# Patient Record
Sex: Female | Born: 1950
Health system: Southern US, Community
[De-identification: ages and names within clinical notes are randomized; demographics above are authoritative.]

## PROBLEM LIST (undated history)

## (undated) DIAGNOSIS — E559 Vitamin D deficiency, unspecified: Secondary | ICD-10-CM

## (undated) DIAGNOSIS — E785 Hyperlipidemia, unspecified: Secondary | ICD-10-CM

## (undated) DIAGNOSIS — R6882 Decreased libido: Secondary | ICD-10-CM

## (undated) DIAGNOSIS — E039 Hypothyroidism, unspecified: Secondary | ICD-10-CM

## (undated) HISTORY — DX: Vitamin D deficiency, unspecified: E55.9

## (undated) HISTORY — PX: APPENDECTOMY: SHX54

## (undated) HISTORY — PX: BREAST SURGERY: SHX581

## (undated) HISTORY — PX: AUGMENTATION MAMMAPLASTY: SUR837

## (undated) HISTORY — DX: Decreased libido: R68.82

## (undated) HISTORY — DX: Hypothyroidism, unspecified: E03.9

## (undated) HISTORY — DX: Hyperlipidemia, unspecified: E78.5

---

## 2017-07-17 ENCOUNTER — Ambulatory Visit (INDEPENDENT_AMBULATORY_CARE_PROVIDER_SITE_OTHER): Payer: Medicare Other | Admitting: Family Medicine

## 2017-07-17 ENCOUNTER — Other Ambulatory Visit: Payer: Self-pay

## 2017-07-17 ENCOUNTER — Encounter: Payer: Self-pay | Admitting: Family Medicine

## 2017-07-17 VITALS — BP 120/80 | HR 65 | Temp 97.5°F | Resp 16 | Ht 66.5 in | Wt 172.2 lb

## 2017-07-17 DIAGNOSIS — Z79899 Other long term (current) drug therapy: Secondary | ICD-10-CM | POA: Diagnosis not present

## 2017-07-17 DIAGNOSIS — Z79890 Hormone replacement therapy (postmenopausal): Secondary | ICD-10-CM | POA: Diagnosis not present

## 2017-07-17 DIAGNOSIS — R002 Palpitations: Secondary | ICD-10-CM | POA: Diagnosis not present

## 2017-07-17 DIAGNOSIS — Z9229 Personal history of other drug therapy: Secondary | ICD-10-CM | POA: Diagnosis not present

## 2017-07-17 DIAGNOSIS — E039 Hypothyroidism, unspecified: Secondary | ICD-10-CM | POA: Diagnosis not present

## 2017-07-17 LAB — HEPATIC FUNCTION PANEL
AG Ratio: 1.6 (calc) (ref 1.0–2.5)
ALBUMIN MSPROF: 4.1 g/dL (ref 3.6–5.1)
ALT: 8 U/L (ref 6–29)
AST: 12 U/L (ref 10–35)
Alkaline phosphatase (APISO): 43 U/L (ref 33–130)
BILIRUBIN DIRECT: 0.1 mg/dL (ref 0.0–0.2)
GLOBULIN: 2.5 g/dL (ref 1.9–3.7)
Indirect Bilirubin: 0.4 mg/dL (calc) (ref 0.2–1.2)
TOTAL PROTEIN: 6.6 g/dL (ref 6.1–8.1)
Total Bilirubin: 0.5 mg/dL (ref 0.2–1.2)

## 2017-07-17 LAB — CBC WITH DIFFERENTIAL/PLATELET
BASOS PCT: 1 %
Basophils Absolute: 69 cells/uL (ref 0–200)
EOS PCT: 1.3 %
Eosinophils Absolute: 90 cells/uL (ref 15–500)
HCT: 42 % (ref 35.0–45.0)
Hemoglobin: 13.7 g/dL (ref 11.7–15.5)
Lymphs Abs: 1718 cells/uL (ref 850–3900)
MCH: 28.9 pg (ref 27.0–33.0)
MCHC: 32.6 g/dL (ref 32.0–36.0)
MCV: 88.6 fL (ref 80.0–100.0)
MONOS PCT: 7.6 %
MPV: 11.6 fL (ref 7.5–12.5)
NEUTROS PCT: 65.2 %
Neutro Abs: 4499 cells/uL (ref 1500–7800)
Platelets: 265 10*3/uL (ref 140–400)
RBC: 4.74 10*6/uL (ref 3.80–5.10)
RDW: 12.1 % (ref 11.0–15.0)
TOTAL LYMPHOCYTE: 24.9 %
WBC: 6.9 10*3/uL (ref 3.8–10.8)
WBCMIX: 524 {cells}/uL (ref 200–950)

## 2017-07-17 LAB — LIPID PANEL
CHOL/HDL RATIO: 4.5 (calc) (ref <5.0)
CHOLESTEROL: 288 mg/dL — AB (ref <200)
HDL: 64 mg/dL (ref >50)
LDL Cholesterol (Calc): 201 mg/dL (calc) — ABNORMAL HIGH
Non-HDL Cholesterol (Calc): 224 mg/dL (calc) — ABNORMAL HIGH (ref <130)
TRIGLYCERIDES: 97 mg/dL (ref <150)

## 2017-07-17 LAB — BASIC METABOLIC PANEL
BUN: 12 mg/dL (ref 7–25)
CALCIUM: 9.3 mg/dL (ref 8.6–10.4)
CHLORIDE: 102 mmol/L (ref 98–110)
CO2: 27 mmol/L (ref 20–32)
CREATININE: 0.92 mg/dL (ref 0.50–0.99)
Glucose, Bld: 82 mg/dL (ref 65–139)
POTASSIUM: 4.5 mmol/L (ref 3.5–5.3)
Sodium: 137 mmol/L (ref 135–146)

## 2017-07-17 LAB — TSH: TSH: 1.35 m[IU]/L (ref 0.40–4.50)

## 2017-07-17 MED ORDER — ESTRADIOL 0.05 MG/24HR TD PTTW
1.0000 | MEDICATED_PATCH | TRANSDERMAL | 2 refills | Status: DC
Start: 2017-07-17 — End: 2017-08-19

## 2017-07-17 MED ORDER — PROGESTERONE MICRONIZED 100 MG PO CAPS: 100.0000 mg | ORAL_CAPSULE | Freq: Every day | ORAL | 2 refills | Status: DC

## 2017-07-17 NOTE — Progress Notes (Signed)
Patient ID: Rachael Padilla, female    DOB: 07-19-1951, 66 y.o.   MRN: [id number]  Chief Complaint  Patient presents with  . Other    hormone therapy- has been on for 8-10 years    Allergies Patient has no known allergies.  Subjective:   Rachael Padilla is a 66 y.o. female who presents to [facility] today.  HPI Here to establish care. Patient reports that she has lived in [place] for the past 14 years. Reports that her husband and her decided to move to [place], [place]. Reports that he chose to live here because they wanted to be near hills and mountains. 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 She tries to exercise and she swims each day.  She reports that most her family lives in [place] and also has a twin sister in [place].  Has several children but they do not live in this area.   Reports that she cannot make it without hormone replacement therapy. Reports that she has been on it for 8-10 years and it makes her life bearable. Reports that if she is not on any hormone replacement therapy that she has a lot of pain. She reports that she has seen lots of doctors for this feeling and pain and she believes it has been determined that her body pain is due to estrogen loss. She reports that she has seen doctors throughout [place], in [place], [place], [place]. She reports that she has an estrogen deficiency and her symptoms are much improved on hormone replacement therapy. She brings in her hormone replacement therapy that she was taking which is from [place]. She was on progesterone 200 mg a day and estrogen patch 50. She denies any higher gynecologic problems. She reports that she has had no vaginal bleeding. She went through menopause sometime in her 48s and started hormone replacement shortly after that. Reports that if she does not take hormone replacement therapy that her body hurts, vaginal dryness it is unbearable, and she  suffers in her mood. She reports that she does not get hot flashes. Reports that she is also been told that she is hypothyroid. Seen by Dr. Augusto Gamble in [place], [place]. Put on actual thyroid medication. However she has not been on any thyroid medication for months. Would like to get her levels checked. Believe that the combo that she was on in [place] for hormones helped her symptoms and pain. Believes that the loss of hormones was the problems.  Was on thyroid 81.25 mg SR capsules, [facility].  After talking with patient for extended period of time I listened to her heart which sounded to have some extra beats. Then upon questioning she reports that she has had a long history of palpitations on and off but they don't really bother her. She reports that every now and then her heart will race and skipped beats. She denies any chest pain shortness of breath. She denies any shortness of breath or symptoms with the palpitations. She reports that they can come on with exercise, rest, or for no reason at all. Nothing seems to make them better and nothing seems to make them worse. She reports that she was told in the past that she had a heart murmur but has never had any evaluation of this other than she thinks she had an ultrasound in the past but does not know the results.      Past Medical History:  Diagnosis Date  . Hypothyroid     Past Surgical History:  Procedure Laterality Date  . APPENDECTOMY     early 42s  . BREAST SURGERY     implants- early 2000    Family History  Problem Relation Age of Onset  . Mental illness Mother   . Dementia Mother   . Early death Father   . Emphysema Father   . Arthritis Brother   . Early death Brother   . Cirrhosis Brother   . Alcohol abuse Brother      Social History   Social History  . Marital status: Married    Spouse name: N/A  . Number of children: N/A  . Years of education: N/A   Occupational History  . retired      Social History Main Topics  . Smoking status: Former Smoker    Quit date: 1979  . Smokeless tobacco: Never Used  . Alcohol use Yes     Comment: rarely  . Drug use: No  . Sexual activity: Yes    Partners: Male    Birth control/ protection: Post-menopausal   Other Topics Concern  . None   Social History Narrative   Lives in [place]. Moved from [place]. 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     Review of Systems   Objective:   BP 120/80 (BP Location: Left Arm, Patient Position: Sitting, Cuff Size: Normal)   Pulse 65   Temp (!) 97.5 F (36.4 C) (Other (Comment))   Resp 16   Ht 5' 6.5" (1.689 m)   Wt 172 lb 4 oz (78.1 kg)   SpO2 99%   BMI 27.39 kg/m   Physical Exam  Constitutional: She is oriented to person, place, and time. She appears well-developed and well-nourished. No distress.  HENT:  Head: Normocephalic and atraumatic.  Eyes: Pupils are equal, round, and reactive to light. Conjunctivae and EOM are normal.  Neck: Normal range of motion. Neck supple. No thyromegaly present.  Cardiovascular: Normal rate and normal heart sounds.  An irregular rhythm present.  Pulmonary/Chest: Effort normal and breath sounds normal. No respiratory distress.  Neurological: She is alert and oriented to person, place, and time. No cranial nerve deficit.  Skin: Skin is warm and dry.  Nursing note and vitals reviewed. Psychiatric: Mood slightly elevated, affect consistent with mood. Pleasant female. Behavior and dress appropriate. Denies suicidal ideations. EKG performed which revealed PVCs.  Assessment and Plan   1. Hypothyroidism, unspecified type Not been on medication for months. Check levels at this time and start therapy as needed or indicated. - TSH  2. Hormone replacement therapy (HRT) Counseled in detail regarding risks of hormone replacement therapy including blood clots, heart attack, stroke, neurologic complications, and possible risk of death and disability. Patient  reports that she is unable to tolerate not having hormone replacement therapy. He requested higher dose of estradiol. However after discussion with her I told her I'm not comfortable giving her higher dose medication at this time we will try this brand and this formulation at this time and follow-up in a month. - estradiol (VIVELLE-DOT) 0.05 MG/24HR patch; Place 1 patch (0.05 mg total) onto the skin 2 (two) times a week.  Dispense: 8 patch; Refill: 2 - progesterone (PROMETRIUM) 100 MG capsule; Take 1 capsule (100 mg total) by mouth daily.  Dispense: 30 capsule; Refill: 2  3. High risk medication use Discussed risks of medications and laboratory monitoring needed. Voiced understanding. - Basic metabolic panel - CBC with Differential/Platelet - Hepatic  function panel - Lipid panel  4. Heart palpitations Reportedly a chronic issue with possibly some mitral valve prolapse. Patient needs evaluation at this time and monitor placed. Check labs. EKG performed. Patient was counseled to decrease her caffeine intake. She was told that if she developed any worrisome signs or symptoms to go to the emergency department for evaluation. - Ambulatory referral to Cardiology  Patient to follow-up for complete physical examination. Reports she has had a mammogram, bone density, Pap smear, and complete physical exam in [place] this calendar year, but will follow-up and bring records of testing. Immunization records requested. Diet and exercise modifications discussed. Office visit was greater than 45 minutes. Return in about 4 weeks (around 08/14/2017) for Physical/HRT. Caren Macadam, MD 07/17/2017

## 2017-07-18 ENCOUNTER — Encounter: Payer: Self-pay | Admitting: Family Medicine

## 2017-07-22 ENCOUNTER — Telehealth: Payer: Self-pay | Admitting: *Deleted

## 2017-07-22 DIAGNOSIS — Z79890 Hormone replacement therapy (postmenopausal): Secondary | ICD-10-CM

## 2017-07-22 MED ORDER — PROGESTERONE MICRONIZED 200 MG PO CAPS: 200.0000 mg | ORAL_CAPSULE | Freq: Every day | ORAL | 1 refills | Status: DC

## 2017-07-22 NOTE — Telephone Encounter (Signed)
Patient called left message stating she would like for her progesterone 100mg  to be changed to 200mg . Patient stated she 100mg  makes her wake up every hour at night. Please advise patient if this can be changed to 200mg . [phone]

## 2017-07-22 NOTE — Telephone Encounter (Signed)
Please advise that I called in the prometrium 200 mg a day. D/c the other dose.  Keep follow up.  Gwen Her. Mannie Stabile, MD

## 2017-07-23 NOTE — Telephone Encounter (Signed)
Patient informed of message below, verbalized understanding.  

## 2017-08-04 ENCOUNTER — Telehealth: Payer: Self-pay | Admitting: *Deleted

## 2017-08-04 DIAGNOSIS — R002 Palpitations: Secondary | ICD-10-CM

## 2017-08-04 NOTE — Telephone Encounter (Signed)
Patient called left message stating hear care told her they will need a new referral, patient is requesting a referral to be resent so she can be seen. Please advise

## 2017-08-05 NOTE — Telephone Encounter (Signed)
Please check into this, I did a referral. See if done. Gwen Her. Mannie Stabile, MD

## 2017-08-05 NOTE — Telephone Encounter (Signed)
A referral had been placed, but when they called patient to schedule she told them she was not aware of the referral and did not want to schedule until she had talked to [place]. She called and I explained why she was being referred so she called back to schedule but by then they had already 'canceled' the referral and they were unable to authorize and schedule from that referral .

## 2017-08-05 NOTE — Telephone Encounter (Signed)
Done

## 2017-08-05 NOTE — Telephone Encounter (Signed)
Place again under the same reason. Rachael Padilla. Rachael Stabile, MD

## 2017-08-07 ENCOUNTER — Telehealth: Payer: Self-pay | Admitting: *Deleted

## 2017-08-07 NOTE — Telephone Encounter (Signed)
Patient called with questions regarding labs

## 2017-08-11 NOTE — Telephone Encounter (Signed)
Called patient regarding message below. No answer, unable to leave message.  

## 2017-08-12 ENCOUNTER — Encounter: Payer: Self-pay | Admitting: Family Medicine

## 2017-08-19 ENCOUNTER — Other Ambulatory Visit: Payer: Self-pay

## 2017-08-19 ENCOUNTER — Encounter: Payer: Self-pay | Admitting: Family Medicine

## 2017-08-19 ENCOUNTER — Ambulatory Visit (INDEPENDENT_AMBULATORY_CARE_PROVIDER_SITE_OTHER): Payer: Medicare Other | Admitting: Family Medicine

## 2017-08-19 VITALS — BP 120/76 | HR 89 | Temp 98.1°F | Resp 16 | Ht 67.0 in | Wt 173.5 lb

## 2017-08-19 DIAGNOSIS — E7800 Pure hypercholesterolemia, unspecified: Secondary | ICD-10-CM | POA: Diagnosis not present

## 2017-08-19 DIAGNOSIS — E039 Hypothyroidism, unspecified: Secondary | ICD-10-CM

## 2017-08-19 DIAGNOSIS — Z79890 Hormone replacement therapy (postmenopausal): Secondary | ICD-10-CM | POA: Diagnosis not present

## 2017-08-19 MED ORDER — ESTRADIOL 0.075 MG/24HR TD PTTW: 1.0000 | MEDICATED_PATCH | TRANSDERMAL | 12 refills | Status: DC

## 2017-08-19 NOTE — Progress Notes (Signed)
Patient ID: Rachael Padilla, female    DOB: May 01, 1951, 66 y.o.   MRN: [id number]  Chief Complaint  Patient presents with  . Follow-up    Allergies Patient has no known allergies.  Subjective:   Rachael Padilla is a 66 y.o. female who presents to [facility] today.  HPI Here to discuss her cholesterol results.  Reports that she has been told she has high cholesterol in the past and she was able to get her cholesterol lowered with dietary modifications.  She reports that she was previously exercising but has not been doing her swimming since she moved to [place].  Reports that after getting the news of her high cholesterol that she has just started the diet.   Would also like to discuss her hormone replacement therapy.  Would like to increase the estradiol to 0.075 mg / 24 hours.  Reports that she felt better when she was on this dosing.  Reports she is been on hormone replacement therapy for about 14 years.  Reports she did not initially go on hormone replacement therapy right after menopause because she was not symptomatic.  Reports if she does not take hormone replacement therapy that her body aches terribly.  She has vaginal pain if she is not on hormone replacement therapy.  Reports that she has terrible vaginal dryness and pain with intercourse if she is not on hormone replacement therapy.  Reports her mood is horrible and she cannot deal with not being on hormone replacement therapy.  She reports that she has been seen by multiple physicians in the past for hormone replacement therapy and is well aware of the risks.  But she reports that it is also a very big risk for her to be in a bad mood and have all the other symptoms that affect her quality of life and her not being able to function.    Past Medical History:  Diagnosis Date  . Hypothyroid     Past Surgical History:  Procedure Laterality Date  . APPENDECTOMY     early 55s  . BREAST SURGERY     implants- early 2000    Family History  Problem Relation Age of Onset  . Mental illness Mother   . Dementia Mother   . Early death Father   . Emphysema Father   . Arthritis Brother   . Early death Brother   . Cirrhosis Brother   . Alcohol abuse Brother      Social History   Socioeconomic History  . Marital status: Married    Spouse name: None  . Number of children: None  . Years of education: None  . Highest education level: None  Social Needs  . Financial resource strain: None  . Food insecurity - worry: None  . Food insecurity - inability: None  . Transportation needs - medical: None  . Transportation needs - non-medical: None  Occupational History  . Occupation: retired  Tobacco Use  . Smoking status: Former Smoker    Last attempt to quit: 1979    Years since quitting: 39.9  . Smokeless tobacco: Never Used  Substance and Sexual Activity  . Alcohol use: Yes    Comment: rarely  . Drug use: No  . Sexual activity: Yes    Partners: Male    Birth control/protection: Post-menopausal  Other Topics Concern  . None  Social History Narrative   Lives in [place]. Moved from [place]. 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     Review  of Systems   Objective:   BP 120/76 (BP Location: Left Arm, Patient Position: Sitting, Cuff Size: Normal)   Pulse 89   Temp 98.1 F (36.7 C) (Other (Comment))   Resp 16   Ht 5\' 7"  (1.702 m)   Wt 173 lb 8 oz (78.7 kg)   SpO2 97%   BMI 27.17 kg/m   Physical Exam  Constitutional: She appears well-developed and well-nourished.  HENT:  Head: Normocephalic and atraumatic.  Eyes: EOM are normal. Pupils are equal, round, and reactive to light.  Neck: Normal range of motion. Neck supple.  Cardiovascular: Normal rate and regular rhythm.  Psychiatric: She has a normal mood and affect. Her behavior is normal. Judgment and thought content normal.  Vitals reviewed.    Assessment and Plan    1. Pure hypercholesterolemia Hyperlipidemia and  the associated risk of ASCVD were discussed today. Primary vs. Secondary prevention of ASCVD were discussed and how it relates to patient morbidity, mortality, and quality of life. Shared decision making with patient including the risks of statins vs.benefits of ASCVD risk reduction discussed.   We discussed heart healthy diet, lifestyle modifications, risk factor modifications, and adherence to the recommended treatment plan.  Patient defers any statin therapy at this time.  She reports that she believes she can get her cholesterol values down to the recommended range without taking a medication.  She is agreed to start making these lifestyle changes and to return to the clinic in 3 months for a fasting lipid profile.  We did discuss today that people get elevated cholesterol by either producing it or eating it.  I told her that I guess she had familial hyper cholesterolemia because her LDL levels were so elevated.  She is still insistent on making dietary changes prior to starting any medications.  - Lipid panel  2. Hypothyroidism, unspecified type Thyroid results were discussed with patient.  She has been off thyroid medication for many months and her TSH was within normal limits.  She would like this rechecked in 3 months. - TSH  3. Hormone replacement therapy (HRT) Very long discussion today with patient regarding the risks of hormone therapy.  Today we discussed the cardiovascular, malignancy, thromboembolic, and liver issues related to hormone replacement therapy.  We also discussed how hormone replacement therapy could further elevate her cholesterol.  Her calculated 10-year ASCVD risk is less than 10%.  She has no family history of GYN malignancy or breast cancer.  Her mammogram is up-to-date and all her testing has been negative.  She reports that she is unable to survive and function without her hormone therapy due to her symptoms.  She does request the 0.75 dosing of the hormone replacement  therapy.  In addition she is continuing the progesterone as previously prescribed.  We did discuss that if she needs further increase in her hormone replacement therapy that she will need to see a gynecologist.  We also discussed that it is not recommended for people to generally be on hormone replacement therapy for more than 10 years after menopause.  She does not agree with this literature and believes that it is very beneficial to be on the hormone replacement therapy but does understand the risks and wishes to continue at this time. - estradiol (VIVELLE-DOT) 0.075 MG/24HR; Place 1 patch onto the skin 2 (two) times a week.  Dispense: 8 patch; Refill: 12 She does have an upcoming appointment with cardiology due to PVCs and questionable history of mitral valve prolapse.  Feel at this time it would be beneficial to get cardiac evaluation due to her continuing hormone replacement therapy.  Office visit greater than 30 minutes and greater than 50% of time spent face-to-face counseling patient. Return in about 4 weeks (around 09/16/2017) for follow up . Caren Macadam, MD 08/20/2017

## 2017-08-20 ENCOUNTER — Ambulatory Visit (INDEPENDENT_AMBULATORY_CARE_PROVIDER_SITE_OTHER): Payer: Medicare Other | Admitting: Cardiovascular Disease

## 2017-08-20 ENCOUNTER — Ambulatory Visit (INDEPENDENT_AMBULATORY_CARE_PROVIDER_SITE_OTHER): Payer: Medicare Other

## 2017-08-20 ENCOUNTER — Encounter: Payer: Self-pay | Admitting: Cardiovascular Disease

## 2017-08-20 ENCOUNTER — Other Ambulatory Visit: Payer: Self-pay

## 2017-08-20 ENCOUNTER — Encounter: Payer: Self-pay | Admitting: Family Medicine

## 2017-08-20 VITALS — BP 98/64 | HR 74 | Ht 66.5 in | Wt 172.0 lb

## 2017-08-20 DIAGNOSIS — E7800 Pure hypercholesterolemia, unspecified: Secondary | ICD-10-CM | POA: Diagnosis not present

## 2017-08-20 DIAGNOSIS — I341 Nonrheumatic mitral (valve) prolapse: Secondary | ICD-10-CM

## 2017-08-20 DIAGNOSIS — I499 Cardiac arrhythmia, unspecified: Secondary | ICD-10-CM | POA: Diagnosis not present

## 2017-08-20 DIAGNOSIS — R002 Palpitations: Secondary | ICD-10-CM

## 2017-08-20 LAB — ECHOCARDIOGRAM COMPLETE
HEIGHTINCHES: 66.5 in
WEIGHTICAEL: 2752 [oz_av]

## 2017-08-20 NOTE — Patient Instructions (Signed)
Medication Instructions:  Your physician recommends that you continue on your current medications as directed. Please refer to the Current Medication list given to you today.  Labwork: NONE  Testing/Procedures: Your physician has requested that you have an echocardiogram. Echocardiography is a painless test that uses sound waves to create images of your heart. It provides your doctor with information about the size and shape of your heart and how well your heart's chambers and valves are working. This procedure takes approximately one hour. There are no restrictions for this procedure.   Follow-Up: Your physician wants you to follow-up in: 6 MONTHS WITH DR. KONESWARAN. You will receive a reminder letter in the mail two months in advance. If you don't receive a letter, please call our office to schedule the follow-up appointment.  Any Other Special Instructions Will Be Listed Below (If Applicable).  If you need a refill on your cardiac medications before your next appointment, please call your pharmacy. 

## 2017-08-20 NOTE — Progress Notes (Signed)
CARDIOLOGY CONSULT NOTE  Patient ID: Rachael Padilla MRN: [id number] DOB/AGE: [birth year] 66 y.o.  Admit date: (Not on file) Primary Physician: Caren Macadam, MD Referring Physician: Dr. Mannie Stabile  Reason for Consultation: Palpitations  HPI: Rachael Padilla is a 66 y.o. female who is being seen today for the evaluation of palpitations at the request of Caren Macadam, MD.   She has lived in [place] [facility]) for the past 14 years and recently moved to [place] 10.5 weeks ago.  She and her husband wanted to be near the hills and mountains.  Her husband retired from Environmental [employer].  She is a Theatre manager by profession.  She exercises and swims on a daily basis up to 90 minutes daily.  She has children and grandchildren who live across the country.  She has a long history of palpitations.  She denies accompanying chest pain and shortness of breath.  They can occur both with exercise and while at rest.  There are no exacerbating or alleviating factors.  She was recently started on hormone replacement therapy by her PCP.  Lipids 07/17/17: Total cholesterol 288, LDL 201, HDL 64, triglycerides 97.  ECG performed on 07/17/17 which I personally interpreted demonstrated normal sinus rhythm with no ischemic ST segment or T wave abnormalities, nor any arrhythmias.  She said she was diagnosed with mitral valve prolapse several years ago and had an echocardiogram at that time.  She has not had a recent echocardiogram.  When she gets tired towards the end of the day she has some chest heaviness and occasional palpitations.  She used to have a history of panic attacks several years ago but has not had one in a long time.  She denies exertional chest pain and shortness of breath.  She also denies orthopnea, leg swelling, and proximal nocturnal dyspnea.  She was aware of her lipid results and wants to try to lower them with diet and exercise.  ECG performed in our office today which I personally  interpreted demonstrated sinus rhythm with frequent PVCs.  Family history: Father died of complications related to COPD and tobacco abuse at the age of 59.  Brother died at the age of 60 due to complications from alcoholism and cirrhosis.   No Known Allergies  Current Outpatient Medications  Medication Sig Dispense Refill  . [START ON 08/21/2017] estradiol (VIVELLE-DOT) 0.075 MG/24HR Place 1 patch onto the skin 2 (two) times a week. 8 patch 12  . progesterone (PROMETRIUM) 200 MG capsule Take 1 capsule (200 mg total) by mouth daily. 30 capsule 1   No current facility-administered medications for this visit.     Past Medical History:  Diagnosis Date  . Hypothyroid     Past Surgical History:  Procedure Laterality Date  . APPENDECTOMY     early 75s  . BREAST SURGERY     implants- early 2000    Social History   Socioeconomic History  . Marital status: Married    Spouse name: Not on file  . Number of children: Not on file  . Years of education: Not on file  . Highest education level: Not on file  Social Needs  . Financial resource strain: Not on file  . Food insecurity - worry: Not on file  . Food insecurity - inability: Not on file  . Transportation needs - medical: Not on file  . Transportation needs - non-medical: Not on file  Occupational History  . Occupation: retired  Tobacco Use  . Smoking status: Former  Smoker    Last attempt to quit: 1979    Years since quitting: 39.9  . Smokeless tobacco: Never Used  Substance and Sexual Activity  . Alcohol use: Yes    Comment: rarely  . Drug use: No  . Sexual activity: Yes    Partners: Male    Birth control/protection: Post-menopausal  Other Topics Concern  . Not on file  Social History Narrative   Lives in [place]. Moved from [place]. 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      No family history of premature CAD in 1st degree relatives.  Current Meds  Medication Sig  . [START ON 08/21/2017] estradiol (VIVELLE-DOT)  0.075 MG/24HR Place 1 patch onto the skin 2 (two) times a week.  . progesterone (PROMETRIUM) 200 MG capsule Take 1 capsule (200 mg total) by mouth daily.      Review of systems complete and found to be negative unless listed above in HPI    Physical exam Blood pressure 98/64, pulse 74, height 5' 6.5" (1.689 m), weight 172 lb (78 kg), SpO2 98 %. General: NAD Neck: No JVD, no thyromegaly or thyroid nodule.  Lungs: Clear to auscultation bilaterally with normal respiratory effort. CV: Nondisplaced PMI. Regular rate and irregular rhythm, normal S1/S2, no S3/S4, no murmur.  No peripheral edema.  No carotid bruit.    Abdomen: Soft, nontender, no distention.  Skin: Intact without lesions or rashes.  Neurologic: Alert and oriented x 3.  Psych: Normal affect. Extremities: No clubbing or cyanosis.  HEENT: Normal.   ECG: Most recent ECG reviewed.   Labs: Lab Results  Component Value Date/Time   K 4.5 07/17/2017 11:42 AM   BUN 12 07/17/2017 11:42 AM   CREATININE 0.92 07/17/2017 11:42 AM   ALT 8 07/17/2017 11:42 AM   TSH 1.35 07/17/2017 11:42 AM   HGB 13.7 07/17/2017 11:42 AM     Lipids: Lab Results  Component Value Date/Time   CHOL 288 (H) 07/17/2017 11:42 AM   TRIG 97 07/17/2017 11:42 AM   HDL 64 07/17/2017 11:42 AM        ASSESSMENT AND PLAN:  1.  Palpitations/arrhythmia with frequent PVCs and mitral valve prolapse: I do not appreciate any murmurs over the mitral area.  The criteria for the diagnosis of mitral valve prolapse by echocardiography has changed from what it was several years ago.  I will obtain an echocardiogram to evaluate cardiac structure and function.  She is asymptomatic for the most part with respect to palpitations.  Medical therapy is not indicated at this time.  2.  Hypercholesterolemia: Total cholesterol and LDL are markedly elevated.  LDL is greater than 190; thus, cholesterol reduction is indicated in the form of statin therapy.  She prefers to try  lifestyle modification first.  She was recently started on hormone replacement therapy.  Lipids should be repeated in 3 months.  Given the significantly elevated levels of both total cholesterol and LDL, I suspect she has a familial hypercholesterolemia.    Disposition: Follow up in 6 months  Signed: Kate Sable, M.D., F.A.C.C.  08/20/2017, 10:29 AM

## 2017-08-22 ENCOUNTER — Telehealth: Payer: Self-pay | Admitting: *Deleted

## 2017-08-22 NOTE — Telephone Encounter (Signed)
Patient called stating her estradiol has not been approved. Please advise

## 2017-08-25 NOTE — Telephone Encounter (Signed)
I have not yet been able to PA this medication because the only insurance information we have on file is Medicare with no prescription drug coverage. I have tried to reach patient to see if she has another insurance. I have also faxed pharmacy for additional information. Awaiting a response from either.

## 2017-08-26 ENCOUNTER — Telehealth: Payer: Self-pay | Admitting: *Deleted

## 2017-08-26 NOTE — Telephone Encounter (Signed)
Notes recorded by Laurine Blazer, LPN on 90/05/300 at 4:99 PM EST Patient notified and verbalized understanding. Copy to pmd. ------  Notes recorded by Laurine Blazer, LPN on 69/10/4930 at 4:19 PM EST Left message to return call.  ------  Notes recorded by Herminio Commons, MD on 08/20/2017 at 4:54 PM EST Normal cardiac function. Mild prolapse of mitral valve. Minimal valve leakage.

## 2017-09-02 ENCOUNTER — Telehealth: Payer: Self-pay

## 2017-09-02 NOTE — Telephone Encounter (Signed)
I am attempting PA on medication. It is in process and I will inform her when complete.   Please schedule patient appointment

## 2017-09-02 NOTE — Telephone Encounter (Signed)
Left message on machine to call back and schedule.

## 2017-09-02 NOTE — Telephone Encounter (Signed)
Wants appt with [facility] to discuss treatment plan. Has to received her estrogen patch either.

## 2017-09-26 ENCOUNTER — Ambulatory Visit (INDEPENDENT_AMBULATORY_CARE_PROVIDER_SITE_OTHER): Payer: Medicare Other | Admitting: Family Medicine

## 2017-09-26 ENCOUNTER — Other Ambulatory Visit: Payer: Self-pay

## 2017-09-26 ENCOUNTER — Encounter: Payer: Self-pay | Admitting: Family Medicine

## 2017-09-26 VITALS — BP 120/62 | HR 79 | Temp 98.0°F | Resp 16 | Ht 66.5 in | Wt 174.0 lb

## 2017-09-26 DIAGNOSIS — Z79890 Hormone replacement therapy (postmenopausal): Secondary | ICD-10-CM | POA: Diagnosis not present

## 2017-09-26 DIAGNOSIS — E785 Hyperlipidemia, unspecified: Secondary | ICD-10-CM | POA: Diagnosis not present

## 2017-09-26 MED ORDER — ESTRADIOL 0.075 MG/24HR TD PTTW: 1.0000 | MEDICATED_PATCH | TRANSDERMAL | 3 refills | Status: DC

## 2017-09-26 NOTE — Progress Notes (Signed)
Patient ID: Rachael Padilla, female    DOB: [birth year], 67 y.o.   MRN: [id number]  Chief Complaint  Patient presents with  . Follow-up    Allergies Patient has no known allergies.  Subjective:   Rachael Padilla is a 67 y.o. female who presents to [facility] today.  HPI Here to discuss 3 things: Feels like if the estrogen dose is to much then she gets strange aches and pains in her arms.  But he feels that the estrogen dose is not enough that she gets strange feelings and pains in her extremities.  She reports that she puts the patch on Sunday and Thursday. Felt a strange sensation in her arms on Sunday, so wonders if it is related to it being to high when she initially applies the patch.  She reports that this current dose of her patch, the 0.075 dose is probably the right one for her.  She feels that her vaginal tissues are not is dry.  Reports that her mood is good.  Reports that her energy is good.  Is not having side effects with medication.  She has wanted to let me know this information today.   Second concern is that her prescription is at [facility] cost her $28. Got a coupon so that can get it at a reduced cost.  But reports that she would need a new prescription for me.  Reports that she understands that she has refills and could have the medication transferred but reports that they live in her neighborhood and she does not want to make anyone upset.   She reports that otherwise she would like to review her thyroid test.  She reports that she is surprised that her thyroid is completely normal.  She reports that she has been working on her diet and her exercise and trying to eat better.  She does not want any cholesterol medication at this time.  She understands that her cholesterol is high but believes that she can get it down with diet and exercise alone.  Reports she is trying to be more active and eat a better diet.  Reports that she feels great and  she has no other concerns today.    Past Medical History:  Diagnosis Date  . Hypothyroid     Past Surgical History:  Procedure Laterality Date  . APPENDECTOMY     early 42s  . BREAST SURGERY     implants- early 2000    Family History  Problem Relation Age of Onset  . Mental illness Mother   . Dementia Mother   . Early death Father   . Emphysema Father   . Arthritis Brother   . Early death Brother   . Cirrhosis Brother   . Alcohol abuse Brother      Social History   Socioeconomic History  . Marital status: Married    Spouse name: None  . Number of children: None  . Years of education: None  . Highest education level: None  Social Needs  . Financial resource strain: None  . Food insecurity - worry: None  . Food insecurity - inability: None  . Transportation needs - medical: None  . Transportation needs - non-medical: None  Occupational History  . Occupation: retired  Tobacco Use  . Smoking status: Former Smoker    Last attempt to quit: 1979    Years since quitting: 40.0  . Smokeless tobacco: Never Used  Substance and Sexual Activity  .  Alcohol use: Yes    Comment: rarely  . Drug use: No  . Sexual activity: Yes    Partners: Male    Birth control/protection: Post-menopausal  Other Topics Concern  . None  Social History Narrative   Lives in [place]. Moved from [place]. 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     Review of Systems  Constitutional: Negative for activity change, appetite change and fever.  Eyes: Negative for visual disturbance.  Respiratory: Negative for cough, chest tightness, shortness of breath and wheezing.   Cardiovascular: Negative for chest pain, palpitations and leg swelling.  Gastrointestinal: Negative for abdominal pain, nausea and vomiting.  Genitourinary: Negative for difficulty urinating, dyspareunia, dysuria, frequency, hematuria, urgency, vaginal bleeding, vaginal discharge and vaginal pain.  Neurological: Negative for  dizziness, syncope and light-headedness.  Hematological: Negative for adenopathy.  Psychiatric/Behavioral: Negative for behavioral problems and dysphoric mood. The patient is not nervous/anxious.      Objective:   BP 120/62 (BP Location: Left Arm, Patient Position: Sitting, Cuff Size: Normal)   Pulse 79   Temp 98 F (36.7 C) (Other (Comment))   Resp 16   Ht 5' 6.5" (1.689 m)   Wt 174 lb (78.9 kg)   SpO2 98%   BMI 27.66 kg/m   Physical Exam  Constitutional: She is oriented to person, place, and time. She appears well-developed and well-nourished. No distress.  HENT:  Head: Normocephalic and atraumatic.  Eyes: Pupils are equal, round, and reactive to light.  Neck: Normal range of motion. Neck supple. No thyromegaly present.  Cardiovascular: Normal rate, regular rhythm and normal heart sounds.  Pulmonary/Chest: Effort normal and breath sounds normal. No respiratory distress.  Neurological: She is alert and oriented to person, place, and time. No cranial nerve deficit.  Skin: Skin is warm and dry.  Psychiatric: She has a normal mood and affect. Her speech is normal and behavior is normal. Judgment and thought content normal.  Nursing note and vitals reviewed.    Assessment and Plan   1. Hormone replacement therapy (HRT) Patient request a new prescription be sent in to another pharmacy so that she can use her coupon to get the cheapest price.  In addition she reports that when she follows up in several months she would like her hormones checked on the patch.  We have discussed multiple times in detail regarding her hormone test but she still wishes to get them done despite we are not trying to achieve a certain level of hormones. Patient counseled in detail regarding the risks of medication. Told to call or return to clinic if develop any worrisome signs or symptoms. Patient voiced understanding.  She was specifically counseled regarding the increased cardiovascular and hematologic  risk of being on hormone replacement therapy for longer than necessary at higher doses than necessary.  She does feel that the fits of therapy are greater than the risk. - estradiol (VIVELLE-DOT) 0.075 MG/24HR; Place 1 patch onto the skin 2 (two) times a week.  Dispense: 8 patch; Refill: 3 - Estrogens, total - Estradiol - FSH/LH  2. Hyperlipidemia, unspecified hyperlipidemia type Extremely high LDL and patient does not want statin medication.  She would like to continue with diet and exercise changes for the next couple months and then recheck.  She Artie has a future lab awaiting.  She defers statin therapy again at this time.    Return in about 3 months (around 12/25/2017) for Follow-up. Caren Macadam, MD 09/30/2017

## 2017-09-29 ENCOUNTER — Telehealth: Payer: Self-pay | Admitting: Family Medicine

## 2017-09-29 NOTE — Telephone Encounter (Signed)
Pt is on the last patch 0.075, member wants to drop back down to 0.05 estrogen patch until she sees you in March.,,,   Just an FYI to Dr Mannie Stabile

## 2017-09-30 NOTE — Telephone Encounter (Signed)
Please call and confirm with patient.  She just had me send in a prescription for the 0.075 mcg strength patch at her visit on Friday.

## 2017-09-30 NOTE — Telephone Encounter (Signed)
Called patient regarding message below. No answer, unable to leave message.  

## 2017-10-01 NOTE — Telephone Encounter (Signed)
Called patient regarding message below. No answer, unable to leave message.  

## 2017-10-02 NOTE — Telephone Encounter (Signed)
Called patient regarding message below. No answer, unable to leave message.  

## 2017-10-21 ENCOUNTER — Telehealth: Payer: Self-pay | Admitting: Family Medicine

## 2017-10-21 DIAGNOSIS — Z79890 Hormone replacement therapy (postmenopausal): Secondary | ICD-10-CM

## 2017-10-21 MED ORDER — PROGESTERONE MICRONIZED 200 MG PO CAPS: 200.0000 mg | ORAL_CAPSULE | Freq: Every day | ORAL | 3 refills | Status: DC

## 2017-10-21 NOTE — Telephone Encounter (Signed)
Done

## 2017-10-21 NOTE — Telephone Encounter (Signed)
Can you call in progesterone (PROMETRIUM) 200 MG capsule  With several refills

## 2017-12-10 ENCOUNTER — Other Ambulatory Visit: Payer: Self-pay | Admitting: Family Medicine

## 2017-12-10 DIAGNOSIS — Z79890 Hormone replacement therapy (postmenopausal): Secondary | ICD-10-CM

## 2017-12-12 ENCOUNTER — Telehealth: Payer: Self-pay | Admitting: Family Medicine

## 2017-12-12 DIAGNOSIS — Z79890 Hormone replacement therapy (postmenopausal): Secondary | ICD-10-CM

## 2017-12-12 MED ORDER — ESTRADIOL 0.075 MG/24HR TD PTTW: 1.0000 | MEDICATED_PATCH | TRANSDERMAL | 3 refills | Status: DC

## 2017-12-12 NOTE — Telephone Encounter (Signed)
Pharmacy: [facility] in [place], [place]  Requesting refill for estradiol, also 3 refills For this medications She is on the last one.  Cb# [phone]

## 2017-12-15 ENCOUNTER — Telehealth: Payer: Self-pay | Admitting: Family Medicine

## 2017-12-15 NOTE — Telephone Encounter (Signed)
Pt needs corrected estrogen patch she received 0.075 --however she needs 0.05

## 2017-12-16 ENCOUNTER — Other Ambulatory Visit: Payer: Self-pay | Admitting: Family Medicine

## 2017-12-16 DIAGNOSIS — Z79890 Hormone replacement therapy (postmenopausal): Secondary | ICD-10-CM

## 2017-12-16 NOTE — Telephone Encounter (Signed)
She called back in yesterday but I sent you this note so you would know we had discussed before.

## 2017-12-16 NOTE — Telephone Encounter (Signed)
Message sent to Dr. Mannie Stabile to confirm if I can send in lower dose.

## 2017-12-16 NOTE — Telephone Encounter (Signed)
Patient is wanting to go back to 0.05mg   strength

## 2017-12-16 NOTE — Telephone Encounter (Signed)
Did she just call back? This message is from 2 months ago.

## 2017-12-16 NOTE — Telephone Encounter (Signed)
This is Dr.Hagler's patient, but if you show me how to do this so I will know in the future I will know. Thank you!!  Rachael S.

## 2017-12-17 NOTE — Telephone Encounter (Signed)
Checking status of lower dose estrogen

## 2017-12-25 ENCOUNTER — Ambulatory Visit: Payer: Medicare Other | Admitting: Family Medicine

## 2018-02-09 DIAGNOSIS — N390 Urinary tract infection, site not specified: Secondary | ICD-10-CM | POA: Insufficient documentation

## 2018-02-09 DIAGNOSIS — R319 Hematuria, unspecified: Secondary | ICD-10-CM | POA: Diagnosis not present

## 2018-02-12 ENCOUNTER — Ambulatory Visit: Payer: Medicare Other | Admitting: Family Medicine

## 2018-02-18 ENCOUNTER — Other Ambulatory Visit: Payer: Self-pay | Admitting: Family Medicine

## 2018-02-18 DIAGNOSIS — Z79890 Hormone replacement therapy (postmenopausal): Secondary | ICD-10-CM

## 2018-02-20 ENCOUNTER — Telehealth: Payer: Self-pay | Admitting: Family Medicine

## 2018-02-20 NOTE — Telephone Encounter (Signed)
Patient called and has a rash on her chest and is itching.  Did not know if she needs to go ahead to a dermatologist or wait for a appointment with Dr Mannie Stabile.  Please call her.

## 2018-02-20 NOTE — Telephone Encounter (Signed)
Left message requesting  call back.

## 2018-02-20 NOTE — Telephone Encounter (Signed)
She can follow-up in our office for visit or she can go to the urgent care clinic over the weekend if needed.  Please advised that it will take quite some time to get in with a dermatologist usually.  We can see if we have an appointment for work in visit next week.  If she cannot wait she can go to urgent care.

## 2018-02-23 NOTE — Telephone Encounter (Signed)
Left vm requesting call back. 

## 2018-02-26 ENCOUNTER — Encounter: Payer: Self-pay | Admitting: Family Medicine

## 2018-02-27 ENCOUNTER — Encounter: Payer: Self-pay | Admitting: Family Medicine

## 2018-03-02 DIAGNOSIS — X32XXXA Exposure to sunlight, initial encounter: Secondary | ICD-10-CM | POA: Diagnosis not present

## 2018-03-02 DIAGNOSIS — L570 Actinic keratosis: Secondary | ICD-10-CM | POA: Diagnosis not present

## 2018-03-02 DIAGNOSIS — Z1283 Encounter for screening for malignant neoplasm of skin: Secondary | ICD-10-CM | POA: Diagnosis not present

## 2018-03-02 DIAGNOSIS — D485 Neoplasm of uncertain behavior of skin: Secondary | ICD-10-CM | POA: Diagnosis not present

## 2018-03-02 DIAGNOSIS — D225 Melanocytic nevi of trunk: Secondary | ICD-10-CM | POA: Diagnosis not present

## 2018-03-20 DIAGNOSIS — T63461A Toxic effect of venom of wasps, accidental (unintentional), initial encounter: Secondary | ICD-10-CM | POA: Diagnosis not present

## 2018-03-24 DIAGNOSIS — S39012A Strain of muscle, fascia and tendon of lower back, initial encounter: Secondary | ICD-10-CM | POA: Diagnosis not present

## 2018-04-28 DIAGNOSIS — R21 Rash and other nonspecific skin eruption: Secondary | ICD-10-CM | POA: Diagnosis not present

## 2018-04-28 DIAGNOSIS — W57XXXA Bitten or stung by nonvenomous insect and other nonvenomous arthropods, initial encounter: Secondary | ICD-10-CM | POA: Diagnosis not present

## 2018-04-30 ENCOUNTER — Other Ambulatory Visit: Payer: Self-pay

## 2018-04-30 ENCOUNTER — Ambulatory Visit (INDEPENDENT_AMBULATORY_CARE_PROVIDER_SITE_OTHER): Payer: Medicare Other | Admitting: Family Medicine

## 2018-04-30 ENCOUNTER — Encounter: Payer: Self-pay | Admitting: Family Medicine

## 2018-04-30 VITALS — BP 118/58 | HR 84 | Temp 98.1°F | Resp 12 | Ht 66.5 in | Wt 175.0 lb

## 2018-04-30 DIAGNOSIS — Z1211 Encounter for screening for malignant neoplasm of colon: Secondary | ICD-10-CM

## 2018-04-30 DIAGNOSIS — Z79890 Hormone replacement therapy (postmenopausal): Secondary | ICD-10-CM

## 2018-04-30 DIAGNOSIS — Z87440 Personal history of urinary (tract) infections: Secondary | ICD-10-CM

## 2018-04-30 DIAGNOSIS — Z23 Encounter for immunization: Secondary | ICD-10-CM

## 2018-04-30 MED ORDER — NITROFURANTOIN MACROCRYSTAL 50 MG PO CAPS: 50.0000 mg | ORAL_CAPSULE | Freq: Every day | ORAL | 0 refills | Status: DC | PRN

## 2018-04-30 NOTE — Patient Instructions (Addendum)
Dr. Argie Ramming [facility]

## 2018-04-30 NOTE — Progress Notes (Signed)
Patient ID: Rachael Padilla, female    DOB: 11-Apr-1951, 67 y.o.   MRN: [id number]  Chief Complaint  Patient presents with  . Follow-up    discuss medications    Allergies Patient has no known allergies.  Subjective:   Rachael Padilla is a 67 y.o. female who presents to [facility] today.  HPI Here for follow up. Would like to get a referral to see specialist regarding her hormone replacement. Reports that she has been switching her doses. Reports that if she gets to much estrogen then gets pain in her body but if she does not get enough gets back pain and lower abdominal pain. Would like to see a doctor that works with hormones. Would like recommendations for a new PCP due to me leaving.  Has been seen by a doctor in [place], [place] who specialized in bioidentical hormones. Patient feels like the patch doses are not great and needs some of her hormones compounded. Reports that has been on vivelle dot in the past. Reports that feels better when HRT replacement is correct she does not have any pain in her body.  Would also like to discuss colon cancer screening. Has never had a colonoscopy. No FH of colon cancer. No change in bowel habits. No melena. No rectal bleeding.  Would like a referral to gynecology.  Has had pap smear within the past 12-13 months. No prior documented HPV.  Mammogram about 1 year ago. Has bilateral implants.  Needs refill on the macrodantin. Has not had to get refill since moved back from [place]. Due to recurrent UTI after sex was started on medication. Takes one po prior to intercourse. No dysuria.    Past Medical History:  Diagnosis Date  . Hypothyroid     Past Surgical History:  Procedure Laterality Date  . APPENDECTOMY     early 55s  . BREAST SURGERY     implants- early 2000    Family History  Problem Relation Age of Onset  . Mental illness Mother   . Dementia Mother   . Early death Father   . Emphysema Father   .  Arthritis Brother   . Early death Brother   . Cirrhosis Brother   . Alcohol abuse Brother      Social History   Socioeconomic History  . Marital status: Married    Spouse name: Not on file  . Number of children: Not on file  . Years of education: Not on file  . Highest education level: Not on file  Occupational History  . Occupation: retired  Scientific laboratory technician  . Financial resource strain: Not on file  . Food insecurity:    Worry: Not on file    Inability: Not on file  . Transportation needs:    Medical: Not on file    Non-medical: Not on file  Tobacco Use  . Smoking status: Former Smoker    Last attempt to quit: 1979    Years since quitting: 40.6  . Smokeless tobacco: Never Used  Substance and Sexual Activity  . Alcohol use: Yes    Comment: rarely  . Drug use: No  . Sexual activity: Yes    Partners: Male    Birth control/protection: Post-menopausal  Lifestyle  . Physical activity:    Days per week: Not on file    Minutes per session: Not on file  . Stress: Not on file  Relationships  . Social connections:    Talks on phone: Not on file  Gets together: Not on file    Attends religious service: Not on file    Active member of club or organization: Not on file    Attends meetings of clubs or organizations: Not on file    Relationship status: Not on file  Other Topics Concern  . Not on file  Social History Narrative   Lives in [place]. Moved from [place]. 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    Current Outpatient Medications on File Prior to Visit  Medication Sig Dispense Refill  . estradiol (VIVELLE-DOT) 0.05 MG/24HR patch APPLY 1 PATCH TOPICALLY TWICE A WEEK 24 patch 0  . progesterone (PROMETRIUM) 200 MG capsule TAKE 1 CAPSULE BY MOUTH ONCE DAILY 30 capsule 3   No current facility-administered medications on file prior to visit.     Review of Systems  Constitutional: Negative for activity change, appetite change and fever.  Eyes: Negative for visual  disturbance.  Respiratory: Negative for cough, chest tightness and shortness of breath.   Cardiovascular: Negative for chest pain, palpitations and leg swelling.  Gastrointestinal: Negative for abdominal pain, nausea and vomiting.  Genitourinary: Negative for dysuria, frequency and urgency.  Skin: Negative for rash.  Neurological: Negative for dizziness, syncope and light-headedness.  Hematological: Negative for adenopathy.  Psychiatric/Behavioral: Negative for dysphoric mood and sleep disturbance. The patient is not nervous/anxious.      Objective:   BP (!) 118/58 (BP Location: Left Arm, Patient Position: Sitting, Cuff Size: Large)   Pulse 84   Temp 98.1 F (36.7 C) (Temporal)   Resp 12   Ht 5' 6.5" (1.689 m)   Wt 175 lb 0.6 oz (79.4 kg)   SpO2 97% Comment: room air  BMI 27.83 kg/m   Physical Exam  Constitutional: She is oriented to person, place, and time. She appears well-developed and well-nourished. No distress.  HENT:  Head: Normocephalic and atraumatic.  Eyes: Pupils are equal, round, and reactive to light. Conjunctivae and EOM are normal. No scleral icterus.  Neck: Normal range of motion. Neck supple. No thyromegaly present.  Cardiovascular: Normal rate, regular rhythm and normal heart sounds.  Pulmonary/Chest: Effort normal and breath sounds normal. No respiratory distress.  Abdominal: Soft. Bowel sounds are normal.  Neurological: She is alert and oriented to person, place, and time. No cranial nerve deficit.  Skin: Skin is warm and dry. Capillary refill takes less than 2 seconds.  Psychiatric: She has a normal mood and affect. Her behavior is normal. Judgment and thought content normal.  Nursing note and vitals reviewed.    Assessment and Plan  1. History of recurrent UTIs Refilled medication.  Patient counseled regarding worrisome symptoms of UTI.  Told if develop to contact physician.  Voiced understanding. - nitrofurantoin (MACRODANTIN) 50 MG capsule; Take 1  capsule (50 mg total) by mouth daily as needed.  Dispense: 90 capsule; Refill: 0  2. Screen for colon cancer Colon cancer screening options discussed with patient today.  She has no family history of colon cancer and she is asymptomatic at this time.  Cologuard testing versus colonoscopy.  At this time patient wishes to proceed with Cologuard.  Order placed. - Cologuard  3. Hormone replacement therapy (HRT) Continue current therapy.  Patient will follow-up with Dr. Morrison Old for management.  4. Immunization due Vaccination given. - Pneumococcal polysaccharide vaccine 23-valent greater than or equal to 2yo subcutaneous/IM  Patient will call and schedule with gynecology/Dr. Glo Herring.  Will get Pap and clinical breast exam at that time.  We will also get referral for mammogram  at that time. No follow-ups on file. Caren Macadam, MD 04/30/2018

## 2018-05-02 LAB — COLOGUARD

## 2018-05-14 DIAGNOSIS — Z1211 Encounter for screening for malignant neoplasm of colon: Secondary | ICD-10-CM | POA: Diagnosis not present

## 2018-05-21 ENCOUNTER — Telehealth: Payer: Self-pay | Admitting: Family Medicine

## 2018-05-21 DIAGNOSIS — R195 Other fecal abnormalities: Secondary | ICD-10-CM

## 2018-05-21 NOTE — Telephone Encounter (Signed)
Spoke with patient and advised her of the positive cologuard results and Dr.Haglers treatment recommendations with verbal understanding. Patient understands that someone will call her with an appointment and she needs to have the diagnostic colonoscopy because the cologuard was positive. She verbalized understanding.

## 2018-05-21 NOTE — Telephone Encounter (Signed)
Please call patient and advise that I received her Cologuard test results.  Advise her that the Cologuard test result was positive.  Therefore it is recommended that she have a follow-up structural examination of the colon via a diagnostic colonoscopy.  I have placed a referral for gastroenterology.  Please advise her that it is important for her to receive a diagnostic colonoscopy because she did have a positive Cologuard testing which could mean that she has a colorectal lesion which is causing some bleeding and could be evidence of a colon polyp, adenoma, colorectal cancer, or other lesion.

## 2018-05-26 ENCOUNTER — Encounter: Payer: Self-pay | Admitting: Gastroenterology

## 2018-06-08 DIAGNOSIS — E559 Vitamin D deficiency, unspecified: Secondary | ICD-10-CM | POA: Diagnosis not present

## 2018-06-08 DIAGNOSIS — R5383 Other fatigue: Secondary | ICD-10-CM | POA: Diagnosis not present

## 2018-06-08 DIAGNOSIS — N951 Menopausal and female climacteric states: Secondary | ICD-10-CM | POA: Diagnosis not present

## 2018-06-08 DIAGNOSIS — E119 Type 2 diabetes mellitus without complications: Secondary | ICD-10-CM | POA: Diagnosis not present

## 2018-06-08 DIAGNOSIS — R6882 Decreased libido: Secondary | ICD-10-CM | POA: Diagnosis not present

## 2018-06-08 DIAGNOSIS — Z131 Encounter for screening for diabetes mellitus: Secondary | ICD-10-CM | POA: Diagnosis not present

## 2018-06-08 DIAGNOSIS — Z13220 Encounter for screening for lipoid disorders: Secondary | ICD-10-CM | POA: Diagnosis not present

## 2018-06-08 DIAGNOSIS — Z23 Encounter for immunization: Secondary | ICD-10-CM | POA: Diagnosis not present

## 2018-06-08 DIAGNOSIS — E785 Hyperlipidemia, unspecified: Secondary | ICD-10-CM | POA: Diagnosis not present

## 2018-07-15 DIAGNOSIS — R6882 Decreased libido: Secondary | ICD-10-CM | POA: Diagnosis not present

## 2018-07-15 DIAGNOSIS — N951 Menopausal and female climacteric states: Secondary | ICD-10-CM | POA: Diagnosis not present

## 2018-07-15 DIAGNOSIS — R5383 Other fatigue: Secondary | ICD-10-CM | POA: Diagnosis not present

## 2018-07-15 DIAGNOSIS — E559 Vitamin D deficiency, unspecified: Secondary | ICD-10-CM | POA: Diagnosis not present

## 2018-07-30 DIAGNOSIS — R21 Rash and other nonspecific skin eruption: Secondary | ICD-10-CM | POA: Diagnosis not present

## 2018-09-01 DIAGNOSIS — N951 Menopausal and female climacteric states: Secondary | ICD-10-CM | POA: Diagnosis not present

## 2018-09-01 DIAGNOSIS — R6882 Decreased libido: Secondary | ICD-10-CM | POA: Diagnosis not present

## 2018-09-01 DIAGNOSIS — R5383 Other fatigue: Secondary | ICD-10-CM | POA: Diagnosis not present

## 2018-09-01 DIAGNOSIS — E663 Overweight: Secondary | ICD-10-CM | POA: Diagnosis not present

## 2018-09-02 ENCOUNTER — Other Ambulatory Visit: Payer: Self-pay

## 2018-09-02 ENCOUNTER — Encounter: Payer: Self-pay | Admitting: Nurse Practitioner

## 2018-09-02 ENCOUNTER — Ambulatory Visit (INDEPENDENT_AMBULATORY_CARE_PROVIDER_SITE_OTHER): Payer: Medicare Other | Admitting: Nurse Practitioner

## 2018-09-02 DIAGNOSIS — K5900 Constipation, unspecified: Secondary | ICD-10-CM | POA: Diagnosis not present

## 2018-09-02 DIAGNOSIS — K649 Unspecified hemorrhoids: Secondary | ICD-10-CM | POA: Insufficient documentation

## 2018-09-02 DIAGNOSIS — R195 Other fecal abnormalities: Secondary | ICD-10-CM

## 2018-09-02 MED ORDER — NA SULFATE-K SULFATE-MG SULF 17.5-3.13-1.6 GM/177ML PO SOLN: 1.0000 | ORAL | 0 refills | Status: DC

## 2018-09-02 NOTE — Assessment & Plan Note (Signed)
Rare/intermittent constipation.  This is in the setting of known hemorrhoids.  She does not take anything.  I discussed she can use a dose of MiraLAX or fiber supplement as needed for mild constipation.  Return for follow-up as needed.

## 2018-09-02 NOTE — Progress Notes (Addendum)
REVIEWED-NO ADDITIONAL RECOMMENDATIONS.  Primary Care Physician:  Doree Albee, MD Primary Gastroenterologist:  Dr. Oneida Alar  Chief Complaint  Patient presents with  . positive cologuard    Has not had TCS prior. Does state she has occas bleeding hemorrhoids    HPI:   Rachael Padilla is a 67 y.o. female who presents on referral from primary care for positive Cologuard.  Reviewed information associated with referral including primary care office visit dated 04/30/2018.  At that time she wanted to discuss colon cancer screening, had never had a colonoscopy.  No family history of colon cancer and no GI symptoms.  After discussion the patient requested to proceed with Cologuard, which was ordered.  The patient was notified on 05/21/2018 that her Cologuard was positive.  At this point a diagnostic colonoscopy was recommended.  Today she states she's doing ok overall. Recently moved from [place] a year ago, originally from [place] to have a colonoscopy a couple times in [place] but her husband had to have procedures and she had to cancel. Has intermittent rectal bleeding with known hemorrhoids. Occasionally constipated, doesn't use anything for constipation. Notes bleeding rarely. Denies abdominal pain, N/V, melena, fever, chills, unintentional weight loss. Denies chest pain, dyspnea, dizziness, lightheadedness, syncope, near syncope. Denies any other upper or lower GI symptoms.  Past Medical History:  Diagnosis Date  . Hypothyroid     Past Surgical History:  Procedure Laterality Date  . APPENDECTOMY     early 54s  . BREAST SURGERY     implants- early 2000    Current Outpatient Medications  Medication Sig Dispense Refill  . estradiol (ESTRACE) 0.5 MG tablet Take 0.5 mg by mouth daily.  3  . nitrofurantoin (MACRODANTIN) 50 MG capsule Take 1 capsule (50 mg total) by mouth daily as needed. 90 capsule 0  . progesterone (PROMETRIUM) 200 MG capsule TAKE 1 CAPSULE BY MOUTH  ONCE DAILY 30 capsule 3   No current facility-administered medications for this visit.     Allergies as of 09/02/2018  . (No Known Allergies)    Family History  Problem Relation Age of Onset  . Mental illness Mother   . Dementia Mother   . Early death Father   . Emphysema Father   . Arthritis Brother   . Early death Brother   . Cirrhosis Brother   . Alcohol abuse Brother   . Colon cancer Neg Hx     Social History   Socioeconomic History  . Marital status: Married    Spouse name: Not on file  . Number of children: Not on file  . Years of education: Not on file  . Highest education level: Not on file  Occupational History  . Occupation: retired  Scientific laboratory technician  . Financial resource strain: Not on file  . Food insecurity:    Worry: Not on file    Inability: Not on file  . Transportation needs:    Medical: Not on file    Non-medical: Not on file  Tobacco Use  . Smoking status: Former Smoker    Last attempt to quit: 1979    Years since quitting: 40.9  . Smokeless tobacco: Never Used  Substance and Sexual Activity  . Alcohol use: Yes    Comment: rarely  . Drug use: No  . Sexual activity: Yes    Partners: Male    Birth control/protection: Post-menopausal  Lifestyle  . Physical activity:    Days per week: Not on file    Minutes  per session: Not on file  . Stress: Not on file  Relationships  . Social connections:    Talks on phone: Not on file    Gets together: Not on file    Attends religious service: Not on file    Active member of club or organization: Not on file    Attends meetings of clubs or organizations: Not on file    Relationship status: Not on file  . Intimate partner violence:    Fear of current or ex partner: Not on file    Emotionally abused: Not on file    Physically abused: Not on file    Forced sexual activity: Not on file  Other Topics Concern  . Not on file  Social History Narrative   Lives in [place]. Moved from [place]. 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  Retired Theatre manager.     Review of Systems: Complete ROS negative except as per HPI.    Physical Exam: BP 123/79   Pulse 79   Temp (!) 96.6 F (35.9 C) (Oral)   Ht 5' 6.5" (1.689 m)   Wt 184 lb 9.6 oz (83.7 kg)   BMI 29.35 kg/m  General:   Alert and oriented. Pleasant and cooperative. Well-nourished and well-developed.  Head:  Normocephalic and atraumatic. Eyes:  Without icterus, sclera clear and conjunctiva pink.  Ears:  Normal auditory acuity. Cardiovascular:  S1, S2 present without murmurs appreciated. Extremities without clubbing or edema. Respiratory:  Clear to auscultation bilaterally. No wheezes, rales, or rhonchi. No distress.  Gastrointestinal:  +BS, soft, non-tender and non-distended. No HSM noted. No guarding or rebound. No masses appreciated.  Rectal:  Deferred  Musculoskalatal:  Symmetrical without gross deformities. Neurologic:  Alert and oriented x4;  grossly normal neurologically. Psych:  Alert and cooperative. Normal mood and affect. Heme/Lymph/Immune: No excessive bruising noted.    09/02/2018 9:32 AM   Disclaimer: This note was dictated with voice recognition software. Similar sounding words can inadvertently be transcribed and may not be corrected upon review.

## 2018-09-02 NOTE — Assessment & Plan Note (Signed)
Patient describes a history of known hemorrhoids.  Rare scant toilet tissue hematochezia when she is somewhat constipated.  She is interested in hemorrhoid banding, if amendable.  She does not require any prescriptive management of her hemorrhoids as they are rarely a problem.  Follow-up as needed.

## 2018-09-02 NOTE — Assessment & Plan Note (Signed)
The patient is never had a colonoscopy before.  She has made at least 2 prior times while living in [place] but had to cancel due to husband's illnesses.  She had a Cologuard test was primary care which was positive.  She was referred to [place] for colonoscopy.  No overt GI symptoms other than known hemorrhoids and rare scant toilet tissue hematochezia.  At this point we will proceed with colonoscopy for first-ever screening exam.  Return for follow-up as needed.  Of note she is interested in hemorrhoid banding, if appropriate.  Proceed with colonoscopy +/- hemorrhoid banding with Dr. Oneida Alar in the near future. The risks, benefits, and alternatives have been discussed in detail with the patient. They state understanding and desire to proceed.   The patient is not on any anticoagulants, anxiolytics, chronic pain medications, or antidepressants.  Rare alcohol use, no drug use.  Conscious sedation should be adequate for her procedure.

## 2018-09-02 NOTE — Patient Instructions (Signed)
1. We will schedule your colonoscopy for you. 2. I have made a note to Dr. Oneida Alar, who will be performing your colonoscopy, that you are interested in hemorrhoid banding if it is appropriate. 3. You can use over-the-counter options such as fiber supplements or MiraLAX, as needed for constipation. 4. Return for follow-up as needed for any GI problems. 5. Call us if you have any questions or concerns.  At [facility] we value your feedback. You may receive a survey about your visit today. Please share your experience as we strive to create trusting relationships with our patients to provide genuine, compassionate, quality care.  We appreciate your understanding and patience as we review any laboratory studies, imaging, and other diagnostic tests that are ordered as we care for you. Our office policy is 5 business days for review of these results, and any emergent or urgent results are addressed in a timely manner for your best interest. If you do not hear from our office in 1 week, please contact us.   We also encourage the use of MyChart, which contains your medical information for your review as well. If you are not enrolled in this feature, an access code is on this after visit summary for your convenience. Thank you for allowing [place] to be involved in your care.  It was great to see you today!  I hope you have a Happy Holiday Season!!

## 2018-09-03 NOTE — Progress Notes (Signed)
CC'D TO PCP °

## 2018-09-17 ENCOUNTER — Telehealth: Payer: Self-pay

## 2018-10-05 DIAGNOSIS — R5383 Other fatigue: Secondary | ICD-10-CM | POA: Diagnosis not present

## 2018-10-05 DIAGNOSIS — N951 Menopausal and female climacteric states: Secondary | ICD-10-CM | POA: Diagnosis not present

## 2018-10-05 DIAGNOSIS — R6882 Decreased libido: Secondary | ICD-10-CM | POA: Diagnosis not present

## 2018-10-05 DIAGNOSIS — E663 Overweight: Secondary | ICD-10-CM | POA: Diagnosis not present

## 2018-10-05 NOTE — Telephone Encounter (Signed)
error 

## 2018-10-27 DIAGNOSIS — R6882 Decreased libido: Secondary | ICD-10-CM | POA: Diagnosis not present

## 2018-10-27 DIAGNOSIS — N951 Menopausal and female climacteric states: Secondary | ICD-10-CM | POA: Diagnosis not present

## 2018-11-23 ENCOUNTER — Telehealth: Payer: Self-pay

## 2018-11-23 ENCOUNTER — Encounter (HOSPITAL_COMMUNITY): Admission: RE | Payer: Self-pay | Source: Home / Self Care

## 2018-11-23 ENCOUNTER — Ambulatory Visit (HOSPITAL_COMMUNITY): Admission: RE | Admit: 2018-11-23 | Payer: Medicare Other | Source: Home / Self Care | Admitting: Gastroenterology

## 2018-11-23 SURGERY — COLONOSCOPY
Anesthesia: Moderate Sedation

## 2018-11-23 NOTE — Telephone Encounter (Signed)
Rachael Padilla at [facility] called office. Pt was no show for her TCS this morning. [name] was also unable to contact pt 11/20/18. I tried to call pt, no answer, LMOVM to inform pt she will need OV to reschedule procedure.  FYI to EG.

## 2018-11-23 NOTE — Telephone Encounter (Signed)
Pt called office, OV scheduled for 01/28/19.

## 2018-11-26 NOTE — Telephone Encounter (Signed)
Noted  

## 2019-01-26 DIAGNOSIS — E663 Overweight: Secondary | ICD-10-CM | POA: Diagnosis not present

## 2019-01-26 DIAGNOSIS — N951 Menopausal and female climacteric states: Secondary | ICD-10-CM | POA: Diagnosis not present

## 2019-01-26 DIAGNOSIS — R6882 Decreased libido: Secondary | ICD-10-CM | POA: Diagnosis not present

## 2019-01-26 DIAGNOSIS — R5383 Other fatigue: Secondary | ICD-10-CM | POA: Diagnosis not present

## 2019-01-26 DIAGNOSIS — E559 Vitamin D deficiency, unspecified: Secondary | ICD-10-CM | POA: Diagnosis not present

## 2019-01-28 ENCOUNTER — Telehealth: Payer: Self-pay | Admitting: *Deleted

## 2019-01-28 ENCOUNTER — Other Ambulatory Visit: Payer: Self-pay

## 2019-01-28 ENCOUNTER — Encounter: Payer: Self-pay | Admitting: Nurse Practitioner

## 2019-01-28 ENCOUNTER — Ambulatory Visit (INDEPENDENT_AMBULATORY_CARE_PROVIDER_SITE_OTHER): Payer: Medicare Other | Admitting: Nurse Practitioner

## 2019-01-28 ENCOUNTER — Other Ambulatory Visit: Payer: Self-pay | Admitting: *Deleted

## 2019-01-28 DIAGNOSIS — R195 Other fecal abnormalities: Secondary | ICD-10-CM

## 2019-01-28 DIAGNOSIS — K649 Unspecified hemorrhoids: Secondary | ICD-10-CM

## 2019-01-28 NOTE — Assessment & Plan Note (Signed)
Patient did have a positive Cologuard test.  Is never had a colonoscopy before.  She missed her previous appointment because she wrote down the wrong date and "feels terrible about it."  She still has her prep that was provided with her last colonoscopy scheduling.  No acute changes in her condition.  Still has scant toilet tissue hematochezia related to hemorrhoids and is still interested in hemorrhoid banding during her procedure.  We will proceed with rescheduling at this time.   Of note she is interested in hemorrhoid banding, if appropriate.  Proceed with colonoscopy +/- hemorrhoid banding with Dr. Oneida Alar in the near future. The risks, benefits, and alternatives have been discussed in detail with the patient. They state understanding and desire to proceed.   The patient is not on any anticoagulants, anxiolytics, chronic pain medications, or antidepressants.  Rare alcohol use, no drug use.  Conscious sedation should be adequate for her procedure.

## 2019-01-28 NOTE — Assessment & Plan Note (Signed)
Known hemorrhoids with a hemorrhoid that occasionally protrudes.  She is interested in hemorrhoid banding, as per below.  She did test positive on Cologuard test and is never had a colonoscopy before.  She does have scant toilet tissue hematochezia occasionally/rarely.  We will proceed with colonoscopy as per below.  Possible hemorrhoid banding and further treatments as needed or recommended.

## 2019-01-28 NOTE — Progress Notes (Addendum)
REVIEWED-NO ADDITIONAL RECOMMENDATIONS.  Referring Provider: Doree Albee, MD Primary Care Physician:  Doree Albee, MD Primary GI:  Dr. Oneida Alar  NOTE: Service was provided via telemedicine and was requested by the patient due to COVID-19 pandemic.  Method of visit: Telephone  Patient Location: Home  Provider Location: Office  Reason for Phone Visit: Referral to schedule colonoscopy  The patient was consented to phone follow-up via telephone encounter including billing of the encounter (yes/no): Yes  Persons present on the phone encounter, with roles: Husband  Total time (minutes) spent on medical discussion: 23 minutes  Chief Complaint  Patient presents with   Colonoscopy    HPI:   Rachael Padilla is a 68 y.o. female who presents for virtual visit regarding: Scheduling of colonoscopy.  The patient was last seen in our office 09/02/2018 for constipation, hemorrhoids, positive colorectal screening using Cologuard.  No family history of colon cancer.  Recently moved from [place] 1 year prior but originally from [place].  Has had intermittent rectal bleeding with known hemorrhoids, occasional constipation for which she does not take anything.  No overt GI symptoms.  Cologuard at primary care was positive.  Recommended colonoscopy +/- hemorrhoid banding, fiber supplements for constipation as needed, follow-up as needed.  Colonoscopy was initially scheduled for 11/23/2018.  However, the patient was a no-show for her procedure.  Today she states she's doing well overall. 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 She thought her colonoscopy was 3/19 and feels terrible she missed it. Has rare scant toilet tissue hematochezia in the setting of known hemorrhoids. Denies abdominal pain, N/V, large-volume hematochezia, melena, fever, chills, unintentional weight loss. Denies URI and flu-like symptoms. Denies chest pain, dyspnea, dizziness, lightheadedness, syncope, near  syncope. Denies any other upper or lower GI symptoms.  Past Medical History:  Diagnosis Date   Hypothyroid     Past Surgical History:  Procedure Laterality Date   APPENDECTOMY     early 1970s   BREAST SURGERY     implants- early 2000    Current Outpatient Medications  Medication Sig Dispense Refill   Cyanocobalamin (VITAMIN B-12) 3000 MCG SUBL Place 6,000 mcg under the tongue daily.     Estradiol (ESTRACE VA) Place 0.01 % vaginally at bedtime. Cream Compound at [employer] (3 clicks at bedtime)     folic acid (FOLVITE) 1 MG tablet Take 1 mg by mouth daily.     nitrofurantoin (MACRODANTIN) 50 MG capsule Take 1 capsule (50 mg total) by mouth daily as needed. (Patient taking differently: Take 50 mg by mouth daily as needed (for urinary tract infection.). ) 90 capsule 0   progesterone (PROMETRIUM) 200 MG capsule TAKE 1 CAPSULE BY MOUTH ONCE DAILY (Patient taking differently: Take 200 mg by mouth at bedtime. ) 30 capsule 3   Testosterone Cypionate POWD Apply 1 application topically at bedtime. Testosterone 4% Cream Compound at [employer] (1 click at night)     thyroid (NP THYROID) 90 MG tablet Take 90 mg by mouth daily at 2 am.     Na Sulfate-K Sulfate-Mg Sulf (SUPREP BOWEL PREP KIT) 17.5-3.13-1.6 GM/177ML SOLN Take 1 kit by mouth as directed. (Patient not taking: Reported on 01/28/2019) 1 Bottle 0   No current facility-administered medications for this visit.     Allergies as of 01/28/2019   (No Known Allergies)    Family History  Problem Relation Age of Onset   Mental illness Mother    Dementia Mother    Early death  Father    Emphysema Father    Arthritis Brother    Early death Brother    Cirrhosis Brother    Alcohol abuse Brother    Colon cancer Neg Hx     Social History   Socioeconomic History   Marital status: Married    Spouse name: Not on file   Number of children: Not on file   Years of education: Not  on file   Highest education level: Not on file  Occupational History   Occupation: retired  Scientist, product/process development strain: Not on file   Food insecurity:    Worry: Not on file    Inability: Not on file   Transportation needs:    Medical: Not on file    Non-medical: Not on file  Tobacco Use   Smoking status: Former Smoker    Last attempt to quit: 1979    Years since quitting: 41.3   Smokeless tobacco: Never Used  Substance and Sexual Activity   Alcohol use: Yes    Comment: rarely   Drug use: No   Sexual activity: Yes    Partners: Male    Birth control/protection: Post-menopausal  Lifestyle   Physical activity:    Days per week: Not on file    Minutes per session: Not on file   Stress: Not on file  Relationships   Social connections:    Talks on phone: Not on file    Gets together: Not on file    Attends religious service: Not on file    Active member of club or organization: Not on file    Attends meetings of clubs or organizations: Not on file    Relationship status: Not on file  Other Topics Concern   Not on file  Social History Narrative   Lives in [place]. Moved from [place]. 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     Review of Systems: Complete ROS negative except as per HPI.  Physical Exam: Note: limited exam due to virtual visit General:   Alert and oriented. Pleasant and cooperative. Ears:  Normal auditory acuity. Skin:  Intact without facial significant lesions or rashes. Neurologic:  Alert and oriented x4 Psych:  Alert and cooperative. Normal mood and affect.

## 2019-01-28 NOTE — Patient Instructions (Signed)
Your health issues we discussed today were:   Hemorrhoids and need for colonoscopy: 1. We will schedule your colonoscopy for you 2. If it is appropriate, Dr. Oneida Alar will band any hemorrhoids that she is able to 3. Further recommendations will follow your colonoscopy 4. Continue previous recommendations for topical hemorrhoid treatment as needed (Preparation H, other over-the-counter options, or prescription option if needed) 5. Continue previously made recommendations for helping constipation including MiraLAX and stool softeners.  This will help prevent worsening hemorrhoids  Overall I recommend:  1. Call if you have any questions or concerns 2. Continue your other medications otherwise 3. Follow-up as needed or based on recommendations made after your colonoscopy.   Because of recent events of COVID-19 ("Coronavirus"), follow CDC recommendations:  1. Wash your hand frequently 2. Avoid touching your face 3. Stay away from people who are sick 4. If you have symptoms such as fever, cough, shortness of breath then call your healthcare provider for further guidance 5. If you are sick, STAY AT HOME unless otherwise directed by your healthcare provider. 6. Follow directions from state and national officials regarding staying safe   At [facility] we value your feedback. You may receive a survey about your visit today. Please share your experience as we strive to create trusting relationships with our patients to provide genuine, compassionate, quality care.  We appreciate your understanding and patience as we review any laboratory studies, imaging, and other diagnostic tests that are ordered as we care for you. Our office policy is 5 business days for review of these results, and any emergent or urgent results are addressed in a timely manner for your best interest. If you do not hear from our office in 1 week, please contact us.   We also encourage the use of MyChart, which  contains your medical information for your review as well. If you are not enrolled in this feature, an access code is on this after visit summary for your convenience. Thank you for allowing [place] to be involved in your care.  It was great to see you today!  I hope you have a great day!!

## 2019-01-28 NOTE — Telephone Encounter (Signed)
Called patient and she is scheduled for 1st available appt 7/17 at 9:30am. Patient already has suprep at home. I advised will mail new instructions to her (confirmed address). She voiced understanding. Orders entered.

## 2019-02-01 NOTE — Progress Notes (Signed)
CC'D TO PCP °

## 2019-02-03 DIAGNOSIS — R6882 Decreased libido: Secondary | ICD-10-CM | POA: Diagnosis not present

## 2019-02-03 DIAGNOSIS — N951 Menopausal and female climacteric states: Secondary | ICD-10-CM | POA: Diagnosis not present

## 2019-03-22 DIAGNOSIS — N951 Menopausal and female climacteric states: Secondary | ICD-10-CM | POA: Diagnosis not present

## 2019-03-22 DIAGNOSIS — R6882 Decreased libido: Secondary | ICD-10-CM | POA: Diagnosis not present

## 2019-03-22 DIAGNOSIS — E559 Vitamin D deficiency, unspecified: Secondary | ICD-10-CM | POA: Diagnosis not present

## 2019-03-22 DIAGNOSIS — R5383 Other fatigue: Secondary | ICD-10-CM | POA: Diagnosis not present

## 2019-03-24 ENCOUNTER — Telehealth: Payer: Self-pay

## 2019-03-24 NOTE — Telephone Encounter (Signed)
Called pt, COVID test scheduled for 04/06/19 at 3:20pm (prior to procedure 04/09/19). Pt is aware to quarantine after test until procedure.

## 2019-04-06 ENCOUNTER — Other Ambulatory Visit (HOSPITAL_COMMUNITY)
Admission: RE | Admit: 2019-04-06 | Discharge: 2019-04-06 | Disposition: A | Discharge status: Home / Self Care | Payer: Medicare Other | Source: Ambulatory Visit | Attending: Gastroenterology | Admitting: Gastroenterology

## 2019-04-06 ENCOUNTER — Other Ambulatory Visit: Payer: Self-pay

## 2019-04-06 DIAGNOSIS — Z1159 Encounter for screening for other viral diseases: Secondary | ICD-10-CM | POA: Insufficient documentation

## 2019-04-07 LAB — SARS CORONAVIRUS 2 (TAT 6-24 HRS): SARS Coronavirus 2: NEGATIVE

## 2019-04-09 ENCOUNTER — Other Ambulatory Visit: Payer: Self-pay

## 2019-04-09 ENCOUNTER — Encounter (HOSPITAL_COMMUNITY): Payer: Self-pay

## 2019-04-09 ENCOUNTER — Encounter (HOSPITAL_COMMUNITY)
Admission: RE | Disposition: A | Discharge status: Home / Self Care | Payer: Self-pay | Source: Home / Self Care | Attending: Gastroenterology

## 2019-04-09 ENCOUNTER — Ambulatory Visit (HOSPITAL_COMMUNITY)
Admission: RE | Admit: 2019-04-09 | Discharge: 2019-04-09 | Disposition: A | Discharge status: Home / Self Care | Payer: Medicare Other | Attending: Gastroenterology | Admitting: Gastroenterology

## 2019-04-09 DIAGNOSIS — Z87891 Personal history of nicotine dependence: Secondary | ICD-10-CM | POA: Diagnosis not present

## 2019-04-09 DIAGNOSIS — K648 Other hemorrhoids: Secondary | ICD-10-CM | POA: Diagnosis not present

## 2019-04-09 DIAGNOSIS — Z79890 Hormone replacement therapy (postmenopausal): Secondary | ICD-10-CM | POA: Insufficient documentation

## 2019-04-09 DIAGNOSIS — E039 Hypothyroidism, unspecified: Secondary | ICD-10-CM | POA: Diagnosis not present

## 2019-04-09 DIAGNOSIS — K921 Melena: Secondary | ICD-10-CM | POA: Insufficient documentation

## 2019-04-09 DIAGNOSIS — K644 Residual hemorrhoidal skin tags: Secondary | ICD-10-CM | POA: Insufficient documentation

## 2019-04-09 DIAGNOSIS — K649 Unspecified hemorrhoids: Secondary | ICD-10-CM

## 2019-04-09 DIAGNOSIS — R195 Other fecal abnormalities: Secondary | ICD-10-CM

## 2019-04-09 HISTORY — PX: COLONOSCOPY: SHX5424

## 2019-04-09 HISTORY — PX: HEMORRHOID BANDING: SHX5850

## 2019-04-09 SURGERY — COLONOSCOPY
Anesthesia: Moderate Sedation

## 2019-04-09 MED ORDER — SODIUM CHLORIDE 0.9 % IV SOLN
INTRAVENOUS | Status: DC
  Administered 2019-04-09: 09:00:00 20 mL/h via INTRAVENOUS

## 2019-04-09 MED ORDER — MEPERIDINE HCL 100 MG/ML IJ SOLN
INTRAMUSCULAR | Status: AC
  Filled 2019-04-09: qty 2

## 2019-04-09 MED ORDER — MIDAZOLAM HCL 5 MG/5ML IJ SOLN
INTRAMUSCULAR | Status: DC | PRN
  Administered 2019-04-09 (×2): 1 mg via INTRAVENOUS
  Administered 2019-04-09: 2 mg via INTRAVENOUS
  Administered 2019-04-09: 1 mg via INTRAVENOUS
  Administered 2019-04-09: 2 mg via INTRAVENOUS

## 2019-04-09 MED ORDER — STERILE WATER FOR IRRIGATION IR SOLN
Status: DC | PRN
  Administered 2019-04-09: 2.5 mL

## 2019-04-09 MED ORDER — MIDAZOLAM HCL 5 MG/5ML IJ SOLN
INTRAMUSCULAR | Status: AC
  Filled 2019-04-09: qty 10

## 2019-04-09 MED ORDER — MEPERIDINE HCL 100 MG/ML IJ SOLN
INTRAMUSCULAR | Status: DC | PRN
  Administered 2019-04-09: 50 mg via INTRAVENOUS
  Administered 2019-04-09 (×2): 25 mg via INTRAVENOUS

## 2019-04-09 NOTE — H&P (Signed)
Primary Care Physician:  Doree Albee, MD Primary Gastroenterologist:  Dr. Oneida Alar  Pre-Procedure History & Physical: HPI:  Rachael Padilla is a 68 y.o. female here for RECTAL BLEEDING/POSITIVE COLOGUARD TEST.    Past Medical History:  Diagnosis Date  . Hypothyroid     Past Surgical History:  Procedure Laterality Date  . APPENDECTOMY     early 29s  . BREAST SURGERY     implants- early 2000    Prior to Admission medications   Medication Sig Start Date End Date Taking? Authorizing Provider  Calcium Carbonate (CALCARB 600 PO) Take 600 mg by mouth daily.   Yes [provider]  Cholecalciferol (DIALYVITE VITAMIN D 5000) 125 MCG (5000 UT) capsule Take 15,000 Units by mouth daily.   Yes [provider]  Cyanocobalamin (VITAMIN B-12) 5000 MCG SUBL Place 5,000 mcg under the tongue daily.    Yes [provider]  Estradiol (ESTRACE VA) Place 0.04 % vaginally at bedtime. Cream Compound at [employer] (4 clicks at bedtime)   Yes [provider]  folic acid (FOLVITE) 323 MCG tablet Take 400 mcg by mouth daily.    Yes [provider]  magnesium oxide (MAG-OX) 400 MG tablet Take 400 mg by mouth daily.   Yes [provider]  nitrofurantoin (MACRODANTIN) 50 MG capsule Take 1 capsule (50 mg total) by mouth daily as needed. Patient taking differently: Take 50 mg by mouth daily as needed (for urinary tract infection.).  04/30/18  Yes Hagler, Apolonio Schneiders, MD  progesterone (PROMETRIUM) 200 MG capsule TAKE 1 CAPSULE BY MOUTH ONCE DAILY Patient taking differently: Take 200 mg by mouth at bedtime.  02/19/18  Yes Caren Macadam, MD  Testosterone Cypionate POWD Apply 1 application topically at bedtime. Testosterone 4% Cream Compound at [employer] (1 click at night)   Yes [provider]  thyroid (NP THYROID) 90 MG tablet Take 90 mg by mouth daily at 2 am.   Yes [provider]  vitamin C (ASCORBIC  ACID) 500 MG tablet Take 1,500 mg by mouth daily.   Yes [provider]    Allergies as of 01/28/2019  . (No Known Allergies)    Family History  Problem Relation Age of Onset  . Mental illness Mother   . Dementia Mother   . Early death Father   . Emphysema Father   . Arthritis Brother   . Early death Brother   . Cirrhosis Brother   . Alcohol abuse Brother   . Colon cancer Neg Hx     Social History   Socioeconomic History  . Marital status: Married    Spouse name: Not on file  . Number of children: Not on file  . Years of education: Not on file  . Highest education level: Not on file  Occupational History  . Occupation: retired  Scientific laboratory technician  . Financial resource strain: Not on file  . Food insecurity    Worry: Not on file    Inability: Not on file  . Transportation needs    Medical: Not on file    Non-medical: Not on file  Tobacco Use  . Smoking status: Former Smoker    Quit date: 1979    Years since quitting: 41.5  . Smokeless tobacco: Never Used  Substance and Sexual Activity  . Alcohol use: Yes    Comment: rarely  . Drug use: No  . Sexual activity: Yes    Partners: Male    Birth control/protection: Post-menopausal  Lifestyle  . Physical activity    Days per week: Not on file    Minutes per session: Not on file  . Stress: Not on file  Relationships  . Social Herbalist on phone: Not on file    Gets together: Not on file    Attends religious service: Not on file    Active member of club or organization: Not on file    Attends meetings of clubs or organizations: Not on file    Relationship status: Not on file  . Intimate partner violence    Fear of current or ex partner: Not on file    Emotionally abused: Not on file    Physically abused: Not on file    Forced sexual activity: Not on file  Other Topics Concern  . Not on file  Social History Narrative   Lives in [place]. Moved from [place]. 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      Review of Systems: See HPI, otherwise negative ROS   Physical Exam: There were no vitals taken for this visit. General:   Alert,  pleasant and cooperative in NAD Head:  Normocephalic and atraumatic. Neck:  Supple; Lungs:  Clear throughout to auscultation.    Heart:  Regular rate and rhythm. Abdomen:  Soft, nontender and nondistended. Normal bowel sounds, without guarding, and without rebound.   Neurologic:  Alert and  oriented x4;  grossly normal neurologically.  Impression/Plan:    RECTAL BLEEDING/POSITIVE COLOGUARD TEST.    PLAN: 1. TCS TODAY.  DISCUSSED PROCEDURE, BENEFITS, & RISKS: < 1% chance of medication reaction, bleeding, perforation, or rupture of spleen/liver.  PLAN:  1. TCS/? Hemorrhoid banding TODAY DISCUSSED PROCEDURE, BENEFITS, & RISKS: < 1% chance of medication reaction, ASPIRATION, bleeding, perforation, PELVIC VEIN SEPSIS, or rupture of spleen/liver REQUIRING SURGERY TO FIX IT, OR 10-20% CHANCE POLYPS < 1 CM ARE MISSED.

## 2019-04-09 NOTE — Op Note (Signed)
[facility] Patient Name: Rachael Padilla Procedure Date: 04/09/2019 9:10 AM MRN: [id number] Date of Birth: 07/24/1951 Attending MD: Barney Drain MD, MD CSN: [id number] Age: 68 Admit Type: Outpatient Procedure:                Colonoscopy, DIAGNOSTIC Indications:              Hematochezia, Positive Cologuard test Providers:                Barney Drain MD, MD, Lurline Del, RN, Aram Candela Referring MD:             Doree Albee MD, MD Medicines:                Meperidine 100 mg IV, Midazolam 7 mg IV Complications:            No immediate complications. Estimated Blood Loss:     Estimated blood loss: none. Procedure:                Pre-Anesthesia Assessment:                           - Prior to the procedure, a History and Physical                            was performed, and patient medications and                            allergies were reviewed. The patient's tolerance of                            previous anesthesia was also reviewed. The risks                            and benefits of the procedure and the sedation                            options and risks were discussed with the patient.                            All questions were answered, and informed consent                            was obtained. Prior Anticoagulants: The patient has                            taken no previous anticoagulant or antiplatelet                            agents. ASA Grade Assessment: I - A normal, healthy                            patient. After reviewing the risks and benefits,                            the patient was deemed in satisfactory condition to  undergo the procedure. After obtaining informed                            consent, the colonoscope was passed under direct                            vision. Throughout the procedure, the patient's                            blood pressure, pulse, and oxygen saturations were         monitored continuously. The PCF-[id number] ([id number])                            was introduced through the anus and advanced to the                            the cecum, identified by appendiceal orifice and                            ileocecal valve. The colonoscopy was somewhat                            difficult due to significant looping. Successful                            completion of the procedure was aided by increasing                            the dose of sedation medication, straightening and                            shortening the scope to obtain bowel loop reduction                            and COLOWRAP. The patient tolerated the procedure                            well. The quality of the bowel preparation was                            excellent. The ileocecal valve, appendiceal                            orifice, and rectum were photographed. Scope In: 9:54:40 AM Scope Out: 10:25:37 AM Scope Withdrawal Time: 0 hours 15 minutes 22 seconds  Total Procedure Duration: 0 hours 30 minutes 57 seconds  Findings:      The recto-sigmoid colon, sigmoid colon, descending colon and transverse       colon revealed grossly excessive looping.      The exam was otherwise without abnormality.      Internal hemorrhoids were found. The hemorrhoids were small.      External hemorrhoids were found. The hemorrhoids were moderate. Impression:               -  There was significant looping of the colon.                           - The examination was otherwise normal.                           - Internal hemorrhoids.                           - External hemorrhoids. Moderate Sedation:      Moderate (conscious) sedation was administered by the endoscopy nurse       and supervised by the endoscopist. The following parameters were       monitored: oxygen saturation, heart rate, blood pressure, and response       to care. Total physician intraservice time was 45 minutes. Recommendation:            - Patient has a contact number available for                            emergencies. The signs and symptoms of potential                            delayed complications were discussed with the                            patient. Return to normal activities tomorrow.                            Written discharge instructions were provided to the                            patient.                           - High fiber diet.                           - Continue present medications.                           - Repeat colonoscopy in 10 years for surveillance. Procedure Code(s):        --- Professional ---                           [phone], Colonoscopy, flexible; diagnostic, including                            collection of specimen(s) by brushing or washing,                            when performed (separate procedure)                           99153, Moderate sedation; each additional 15  minutes intraservice time                           99153, Moderate sedation; each additional 15                            minutes intraservice time                           G0500, Moderate sedation services provided by the                            same physician or other qualified health care                            professional performing a gastrointestinal                            endoscopic service that sedation supports,                            requiring the presence of an independent trained                            observer to assist in the monitoring of the                            patient's level of consciousness and physiological                            status; initial 15 minutes of intra-service time;                            patient age 2 years or older (additional time may                            be reported with [phone], as appropriate) Diagnosis Code(s):        --- Professional ---                           K64.4, Residual hemorrhoidal  skin tags                           K64.8, Other hemorrhoids                           K92.1, Melena (includes Hematochezia)                           R19.5, Other fecal abnormalities CPT copyright 2019 American Medical Association. All rights reserved. The codes documented in this report are preliminary and upon coder review may  be revised to meet current compliance requirements. Barney Drain, MD Barney Drain MD, MD 04/09/2019 10:41:25 AM This report has been signed electronically. Number of Addenda: 0

## 2019-04-09 NOTE — Discharge Instructions (Signed)
You have moderate size EXTERNAL hemorrhoids. YOU DID NOT HAVE ANY POLYPS.   DRINK WATER TO KEEP YOUR URINE LIGHT YELLOW.  FOLLOW A HIGH FIBER DIET. AVOID ITEMS THAT CAUSE BLOATING. SEE INFO BELOW.  Next colonoscopy in 10 years.   Colonoscopy Care After Read the instructions outlined below and refer to this sheet in the next week. These discharge instructions provide you with general information on caring for yourself after you leave the hospital. While your treatment has been planned according to the most current medical practices available, unavoidable complications occasionally occur. If you have any problems or questions after discharge, call DR. Darthula Desa, [phone].  ACTIVITY  You may resume your regular activity, but move at a slower pace for the next 24 hours.   Take frequent rest periods for the next 24 hours.   Walking will help get rid of the air and reduce the bloated feeling in your belly (abdomen).   No driving for 24 hours (because of the medicine (anesthesia) used during the test).   You may shower.   Do not sign any important legal documents or operate any machinery for 24 hours (because of the anesthesia used during the test).    NUTRITION  Drink plenty of fluids.   You may resume your normal diet as instructed by your doctor.   Begin with a light meal and progress to your normal diet. Heavy or fried foods are harder to digest and may make you feel sick to your stomach (nauseated).   Avoid alcoholic beverages for 24 hours or as instructed.    MEDICATIONS  You may resume your normal medications.   WHAT YOU CAN EXPECT TODAY  Some feelings of bloating in the abdomen.   Passage of more gas than usual.   Spotting of blood in your stool or on the toilet paper  .  IF YOU HAD POLYPS REMOVED DURING THE COLONOSCOPY:  Eat a soft diet IF YOU HAVE NAUSEA, BLOATING, ABDOMINAL PAIN, OR VOMITING.    FINDING OUT THE RESULTS OF YOUR TEST Not all test results  are available during your visit. DR. Oneida Alar WILL CALL YOU WITHIN 14 DAYS OF YOUR PROCEDUE WITH YOUR RESULTS. Do not assume everything is normal if you have not heard from DR. Keisuke Hollabaugh, CALL HER OFFICE AT [phone].  SEEK IMMEDIATE MEDICAL ATTENTION AND CALL THE OFFICE: [phone] IF:  You have more than a spotting of blood in your stool.   Your belly is swollen (abdominal distention).   You are nauseated or vomiting.   You have a temperature over 101F.   You have abdominal pain or discomfort that is severe or gets worse throughout the day.  High-Fiber Diet A high-fiber diet changes your normal diet to include more whole grains, legumes, fruits, and vegetables. Changes in the diet involve replacing refined carbohydrates with unrefined foods. The calorie level of the diet is essentially unchanged. The Dietary Reference Intake (recommended amount) for adult males is 38 grams per day. For adult females, it is 25 grams per day. Pregnant and lactating women should consume 28 grams of fiber per day. Fiber is the intact part of a plant that is not broken down during digestion. Functional fiber is fiber that has been isolated from the plant to provide a beneficial effect in the body.  PURPOSE  Increase stool bulk.   Ease and regulate bowel movements.   Lower cholesterol.   REDUCE RISK OF COLON CANCER  INDICATIONS THAT YOU NEED MORE FIBER  Constipation and hemorrhoids.  Uncomplicated diverticulosis (intestine condition) and irritable bowel syndrome.   Weight management.   As a protective measure against hardening of the arteries (atherosclerosis), diabetes, and cancer.   GUIDELINES FOR INCREASING FIBER IN THE DIET  Start adding fiber to the diet slowly. A gradual increase of about 5 more grams (2 slices of whole-wheat bread, 2 servings of most fruits or vegetables, or 1 bowl of high-fiber cereal) per day is best. Too rapid an increase in fiber may result in constipation, flatulence,  and bloating.   Drink enough water and fluids to keep your urine clear or pale yellow. Water, juice, or caffeine-free drinks are recommended. Not drinking enough fluid may cause constipation.   Eat a variety of high-fiber foods rather than one type of fiber.   Try to increase your intake of fiber through using high-fiber foods rather than fiber pills or supplements that contain small amounts of fiber.   The goal is to change the types of food eaten. Do not supplement your present diet with high-fiber foods, but replace foods in your present diet.    INCLUDE A VARIETY OF FIBER SOURCES  Replace refined and processed grains with whole grains, canned fruits with fresh fruits, and incorporate other fiber sources. White rice, white breads, and most bakery goods contain little or no fiber.   Brown whole-grain rice, buckwheat oats, and many fruits and vegetables are all good sources of fiber. These include: broccoli, Brussels sprouts, cabbage, cauliflower, beets, sweet potatoes, white potatoes (skin on), carrots, tomatoes, eggplant, squash, berries, fresh fruits, and dried fruits.   Cereals appear to be the richest source of fiber. Cereal fiber is found in whole grains and bran. Bran is the fiber-rich outer coat of cereal grain, which is largely removed in refining. In whole-grain cereals, the bran remains. In breakfast cereals, the largest amount of fiber is found in those with "bran" in their names. The fiber content is sometimes indicated on the label.   You may need to include additional fruits and vegetables each day.   In baking, for 1 cup white flour, you may use the following substitutions:   1 cup whole-wheat flour minus 2 tablespoons.   1/2 cup white flour plus 1/2 cup whole-wheat flour.   Hemorrhoids Hemorrhoids are dilated (enlarged) veins around the rectum. Sometimes clots will form in the veins. This makes them swollen and painful. These are called thrombosed hemorrhoids. Causes of  hemorrhoids include:  Constipation.   Straining to have a bowel movement.   HEAVY LIFTING  HOME CARE INSTRUCTIONS  Eat a well balanced diet and drink 6 to 8 glasses of water every day to avoid constipation. You may also use a bulk laxative.   Avoid straining to have bowel movements.   Keep anal area dry and clean.   Do not use a donut shaped pillow or sit on the toilet for long periods. This increases blood pooling and pain.   Move your bowels when your body has the urge; this will require less straining and will decrease pain and pressure.

## 2019-04-16 ENCOUNTER — Encounter (HOSPITAL_COMMUNITY): Payer: Self-pay | Admitting: Gastroenterology

## 2019-04-29 DIAGNOSIS — E559 Vitamin D deficiency, unspecified: Secondary | ICD-10-CM | POA: Diagnosis not present

## 2019-04-29 DIAGNOSIS — R6882 Decreased libido: Secondary | ICD-10-CM | POA: Diagnosis not present

## 2019-04-29 DIAGNOSIS — N951 Menopausal and female climacteric states: Secondary | ICD-10-CM | POA: Diagnosis not present

## 2019-04-29 DIAGNOSIS — R5383 Other fatigue: Secondary | ICD-10-CM | POA: Diagnosis not present

## 2019-06-04 DIAGNOSIS — Z23 Encounter for immunization: Secondary | ICD-10-CM | POA: Diagnosis not present

## 2019-06-14 ENCOUNTER — Other Ambulatory Visit (INDEPENDENT_AMBULATORY_CARE_PROVIDER_SITE_OTHER): Payer: Self-pay | Admitting: Internal Medicine

## 2019-06-14 DIAGNOSIS — Z79890 Hormone replacement therapy (postmenopausal): Secondary | ICD-10-CM

## 2019-06-22 ENCOUNTER — Other Ambulatory Visit: Payer: Self-pay

## 2019-06-22 ENCOUNTER — Encounter (INDEPENDENT_AMBULATORY_CARE_PROVIDER_SITE_OTHER): Payer: Self-pay | Admitting: Internal Medicine

## 2019-06-22 ENCOUNTER — Ambulatory Visit (INDEPENDENT_AMBULATORY_CARE_PROVIDER_SITE_OTHER): Payer: Medicare Other | Admitting: Internal Medicine

## 2019-06-22 VITALS — BP 130/70 | HR 72 | Ht 66.0 in | Wt 171.4 lb

## 2019-06-22 DIAGNOSIS — E2839 Other primary ovarian failure: Secondary | ICD-10-CM | POA: Diagnosis not present

## 2019-06-22 DIAGNOSIS — E782 Mixed hyperlipidemia: Secondary | ICD-10-CM

## 2019-06-22 DIAGNOSIS — Z124 Encounter for screening for malignant neoplasm of cervix: Secondary | ICD-10-CM

## 2019-06-22 DIAGNOSIS — Z1239 Encounter for other screening for malignant neoplasm of breast: Secondary | ICD-10-CM | POA: Diagnosis not present

## 2019-06-22 DIAGNOSIS — Z1159 Encounter for screening for other viral diseases: Secondary | ICD-10-CM | POA: Diagnosis not present

## 2019-06-22 DIAGNOSIS — E559 Vitamin D deficiency, unspecified: Secondary | ICD-10-CM | POA: Diagnosis not present

## 2019-06-22 DIAGNOSIS — R6882 Decreased libido: Secondary | ICD-10-CM

## 2019-06-22 DIAGNOSIS — E785 Hyperlipidemia, unspecified: Secondary | ICD-10-CM | POA: Insufficient documentation

## 2019-06-22 NOTE — Patient Instructions (Signed)
[facility] Dietary Recommendations for Weight Loss What to Avoid . Avoid added sugars o Often added sugar can be found in processed foods such as many condiments, dry cereals, cakes, cookies, chips, crisps, crackers, candies, sweetened drinks, etc.  o Read labels and AVOID/DECREASE use of foods with the following in their ingredient list: Sugar, fructose, high fructose corn syrup, sucrose, glucose, maltose, dextrose, molasses, cane sugar, brown sugar, any type of syrup, agave nectar, etc.   . Avoid snacking in between meals . Avoid foods made with flour o If you are going to eat food made with flour, choose those made with whole-grains; and, minimize your consumption as much as is tolerable . Avoid processed foods o These foods are generally stocked in the middle of the grocery store. Focus on shopping on the perimeter of the grocery.  What to Include . Vegetables o GREEN LEAFY VEGETABLES: Kale, spinach, mustard greens, collard greens, cabbage, broccoli, etc. o OTHER: Asparagus, cauliflower, eggplant, carrots, peas, Brussel sprouts, tomatoes, bell peppers, zucchini, beets, cucumbers, etc. . Grains, seeds, and legumes o Beans: kidney beans, black eyed peas, garbanzo beans, black beans, pinto beans, etc. o Whole, unrefined grains: brown rice, barley, bulgur, oatmeal, etc. . Healthy fats  o Avoid highly processed fats such as vegetable oil o Examples of healthy fats: avocado, olives, virgin olive oil, dark chocolate (?72% Cocoa), nuts (peanuts, almonds, walnuts, cashews, pecans, etc.) . Low - Moderate Intake of Animal Sources of Protein o Meat sources: chicken, turkey, salmon, tuna. Limit to 4 ounces of meat at one time. o Consider limiting dairy sources, but when choosing dairy focus on: PLAIN Greek yogurt, cottage cheese, high-protein milk . Fruit o Choose berries  When to Eat . Intermittent Fasting: o Choosing not to eat for a specific time period, but DO FOCUS ON HYDRATION  when fasting o Multiple Techniques: - Time Restricted Eating: eat 3 meals in a day, each meal lasting no more than 60 minutes, no snacks between meals - 16-18 hour fast: fast for 16 to 18 hours up to 7 days a week. Often suggested to start with 2-3 nonconsecutive days per week.  . Remember the time you sleep is counted as fasting.  . Examples of eating schedule: Fast from 7:00pm-11:00am. Eat between 11:00am-7:00pm.  - 24-hour fast: fast for 24 hours up to every other day. Often suggested to start with 1 day per week . Remember the time you sleep is counted as fasting . Examples of eating schedule:  o Eating day: eat 2-3 meals on your eating day. If doing 2 meals, each meal should last no more than 90 minutes. If doing 3 meals, each meal should last no more than 60 minutes. Finish last meal by 7:00pm. o Fasting day: Fast until 7:00pm.  o IF YOU FEEL UNWELL FOR ANY REASON/IN ANY WAY WHEN FASTING, STOP FASTING BY EATING A NUTRITIOUS SNACK OR LIGHT MEAL o ALWAYS FOCUS ON HYDRATION DURING FASTS - Acceptable Hydration sources: water, broths, tea/coffee (black tea/coffee is best but using a small amount of whole-fat dairy products in coffee/tea is acceptable).  - Poor Hydration Sources: anything with sugar or artificial sweeteners added to it  These recommendations have been developed for patients that are actively receiving medical care from either Dr. Ayonna Speranza or Sarah Gray, DNP, NP-C at [facility]. These recommendations are developed for patients with specific medical conditions and are not meant to be distributed or used by others that are not actively receiving care from either provider listed   above at [facility]. It is not appropriate to participate in the above eating plans without proper medical supervision.   Reference: Fung, J. The obesity code. [place]: [place]; 2016.   

## 2019-06-22 NOTE — Progress Notes (Signed)
Wellness Office Visit  Subjective:  Patient ID: Rachael Padilla, female    DOB: 05-30-1951  Age: 68 y.o. MRN: [id number]  CC: This lady comes in for follow-up of her multiple medical problems including hyperlipidemia, vitamin D deficiency, overweight state, decreased libido. HPI  Since I have been seeing her, she has been on bioidentical hormone therapy and optimization of this together with eating healthier and exercise.  More recently, she has been swimming very frequently and it is noticeable that she has lost body fat.  Infectious lost about 4- 5 pounds compared to the last time I saw her. She feels well as far as her bioidentical hormones are concerned.  We have been working with optimizing her estradiol cream with the compounding pharmacy.  She is tolerating the higher doses. She continues with testosterone therapy as before. Past Medical History:  Diagnosis Date  . Decreased libido   . HLD (hyperlipidemia)   . Vitamin D deficiency disease       Family History  Problem Relation Age of Onset  . Mental illness Mother   . Dementia Mother   . Early death Father   . Emphysema Father   . Arthritis Brother   . Early death Brother   . Cirrhosis Brother   . Alcohol abuse Brother   . Colon cancer Neg Hx     Social History   Social History Narrative   Married for 21 years Lives in [place]. Moved from [place]. 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      Current Meds  Medication Sig  . Cholecalciferol (DIALYVITE VITAMIN D 5000) 125 MCG (5000 UT) capsule Take 15,000 Units by mouth daily.  . Estradiol (ESTRACE VA) Place 0.04 % vaginally at bedtime. Cream Compound at [employer] (4 clicks at bedtime)  . progesterone (PROMETRIUM) 200 MG capsule TAKE 1 CAPSULE BY MOUTH EVERY NIGHT   . Testosterone Cypionate POWD Apply 1 application topically at bedtime. Testosterone 4% Cream Compound at [employer] (1 click at night)  . thyroid (NP THYROID)  90 MG tablet Take 90 mg by mouth daily at 2 am.  . vitamin C (ASCORBIC ACID) 500 MG tablet Take 1,500 mg by mouth daily.     Nutrition  Healthy.  She tries to avoid junk food. Sleep  Adequate sleep.  Exercise  She swims on a regular basis in the week. Bio Identical Hormones  Testosterone therapy is being used off label for symptoms of testosterone deficiency and benefits that it produces based on several studies.  These benefits include decreasing body fat, increasing in lean muscle mass and increasing in bone density.  There is improvement of memory, cognition.  There is improvement in exercise tolerance and endurance.  Testosterone therapy has also been shown to be protective against coronary artery disease, cerebrovascular disease, diabetes, hypertension and degenerative joint disease. I have discussed with the patient the FDA warnings regarding testosterone therapy, benefits and side effects and modes of administration as well as monitoring blood levels and side effects  on a regular basis The patient is agreeable that testosterone therapy should be an integral part of his/her wellness,quality of life and prevention of chronic disease.  This patient is being treated with desiccated thyroid, off label, for symptoms of thyroid deficiency.  The patient has been counseled regarding side effects and how to deal with them.  Micronized progesterone is being used in this patient for multiple benefits based on studies including protection against uterine cancer, breast cancer, osteoporosis and heart disease.  The patient has been counseled regarding side effects, benefits and modes of administration. The patient is agreeable that this therapy is an integral part of her wellness, quality of life and prevention of chronic disease.  Estradiol is being used in this patient for multiple benefits based on several studies including protection against heart disease, cerebrovascular disease,  osteoporosis, colon cancer, Alzheimer's disease, macular degeneration and cataracts. The patient has been counseled regarding benefits and side effects and modes of administration. The patient is agreeable that this therapy is an integral to part of her wellness, quality of life and prevention of chronic disease.  Objective:   Today's Vitals: BP 130/70   Pulse 72   Ht 5\' 6"  (1.676 m)   Wt 171 lb 6.4 oz (77.7 kg)   BMI 27.66 kg/m  Vitals with BMI 06/22/2019 04/09/2019 04/09/2019  Height 5\' 6"  - -  Weight 171 lbs 6 oz - -  BMI 0000000 - -  Systolic [id number]  Diastolic 70 70 55  Pulse 72 85 84     Physical Exam  She looks systemically well.  Blood pressure well controlled.  She has lost about 4 pounds since the last time I saw her.  Alert and orientated.  Very cheerful.     Assessment   1. Primary ovarian failure   2. Vitamin D deficiency disease   3. Mixed hyperlipidemia   4. Decreased libido   5. Encounter for hepatitis C screening test for low risk patient   6. Cervical cancer screening   7. Breast cancer screening       Tests ordered Orders Placed This Encounter  Procedures  . T3, free  . TSH  . Lipid panel  . Testos,Total,Free and SHBG (Female)  . Estradiol  . Progesterone  . Hepatitis C antibody  . Ambulatory referral to Obstetrics / Gynecology     Plan: 1. She will continue with all her medications for chronic conditions and symptoms above. 2. Blood work is ordered as above. 3. I will send her to Dr. Helane Rima in [place] for her regular Pap smear and care. 4. We will get her to have a screening mammogram which she is due for. 5. I will see her in January of next year for an annual physical exam.     Doree Albee, MD

## 2019-06-23 ENCOUNTER — Other Ambulatory Visit (HOSPITAL_COMMUNITY): Payer: Self-pay | Admitting: Internal Medicine

## 2019-06-23 ENCOUNTER — Other Ambulatory Visit (INDEPENDENT_AMBULATORY_CARE_PROVIDER_SITE_OTHER): Payer: Self-pay | Admitting: Internal Medicine

## 2019-06-23 ENCOUNTER — Encounter (INDEPENDENT_AMBULATORY_CARE_PROVIDER_SITE_OTHER): Payer: Self-pay | Admitting: Internal Medicine

## 2019-06-23 DIAGNOSIS — Z1231 Encounter for screening mammogram for malignant neoplasm of breast: Secondary | ICD-10-CM

## 2019-06-23 MED ORDER — THYROID 120 MG PO TABS: 120.0000 mg | ORAL_TABLET | Freq: Every day | ORAL | 0 refills | Status: DC

## 2019-06-23 MED ORDER — ESTRADIOL 10 % CREA: 2.0000 mg | TOPICAL_CREAM | Freq: Every day | 1 refills | Status: DC

## 2019-06-23 NOTE — Progress Notes (Signed)
Your hepatitis C test is negative which is good news.  Cholesterol is still elevated but significantly better than about a year ago so you have done well. Your thyroid is at the upper limit of normal.  We can increase the thyroid further if you would like, let me know and I will send a new prescription for higher dose. Your estradiol still is not in a optimal range so I think we need to increase the estradiol cream dose.  Let me know if you would like me to do this and I will call [facility]. Your progesterone is optimal so continue with the same dose of that. Be well!

## 2019-06-28 ENCOUNTER — Telehealth (INDEPENDENT_AMBULATORY_CARE_PROVIDER_SITE_OTHER): Payer: Self-pay

## 2019-06-28 LAB — T3, FREE: T3, Free: 4.2 pg/mL (ref 2.3–4.2)

## 2019-06-28 LAB — LIPID PANEL
Cholesterol: 240 mg/dL — ABNORMAL HIGH (ref <200)
HDL: 50 mg/dL (ref >=50)
LDL Cholesterol (Calc): 159 mg/dL (calc) — ABNORMAL HIGH
Non-HDL Cholesterol (Calc): 190 mg/dL (calc) — ABNORMAL HIGH (ref <130)
Total CHOL/HDL Ratio: 4.8 (calc) (ref <5.0)
Triglycerides: 162 mg/dL — ABNORMAL HIGH (ref <150)

## 2019-06-28 LAB — HEPATITIS C ANTIBODY
Hepatitis C Ab: NONREACTIVE
SIGNAL TO CUT-OFF: 0.01 (ref <1.00)

## 2019-06-28 LAB — ESTRADIOL: Estradiol: 31 pg/mL

## 2019-06-28 LAB — TSH: TSH: 0.02 mIU/L — ABNORMAL LOW (ref 0.40–4.50)

## 2019-06-28 LAB — TESTOS,TOTAL,FREE AND SHBG (FEMALE)
Free Testosterone: 13.6 pg/mL — ABNORMAL HIGH (ref 0.1–6.4)
Sex Hormone Binding: 81 nmol/L — ABNORMAL HIGH (ref 14–73)
Testosterone, Total, LC-MS-MS: 137 ng/dL — ABNORMAL HIGH (ref 2–45)

## 2019-06-28 LAB — PROGESTERONE: Progesterone: 17.9 ng/mL

## 2019-06-29 ENCOUNTER — Encounter (INDEPENDENT_AMBULATORY_CARE_PROVIDER_SITE_OTHER): Payer: Self-pay | Admitting: Internal Medicine

## 2019-07-01 NOTE — Telephone Encounter (Signed)
Done

## 2019-07-06 ENCOUNTER — Other Ambulatory Visit (INDEPENDENT_AMBULATORY_CARE_PROVIDER_SITE_OTHER): Payer: Self-pay | Admitting: Internal Medicine

## 2019-07-06 ENCOUNTER — Telehealth (INDEPENDENT_AMBULATORY_CARE_PROVIDER_SITE_OTHER): Payer: Self-pay

## 2019-07-06 MED ORDER — ESTRADIOL 0.05 MG/24HR TD PTTW: 1.0000 | MEDICATED_PATCH | TRANSDERMAL | 6 refills | Status: DC

## 2019-07-06 MED ORDER — ESTRADIOL 0.1 MG/24HR TD PTTW: 1.0000 | MEDICATED_PATCH | TRANSDERMAL | 6 refills | Status: DC

## 2019-07-06 NOTE — Telephone Encounter (Signed)
done

## 2019-07-07 ENCOUNTER — Other Ambulatory Visit (INDEPENDENT_AMBULATORY_CARE_PROVIDER_SITE_OTHER): Payer: Self-pay

## 2019-07-07 DIAGNOSIS — Z1231 Encounter for screening mammogram for malignant neoplasm of breast: Secondary | ICD-10-CM

## 2019-07-08 ENCOUNTER — Other Ambulatory Visit (INDEPENDENT_AMBULATORY_CARE_PROVIDER_SITE_OTHER): Payer: Self-pay | Admitting: Internal Medicine

## 2019-07-08 ENCOUNTER — Encounter (INDEPENDENT_AMBULATORY_CARE_PROVIDER_SITE_OTHER): Payer: Self-pay | Admitting: Internal Medicine

## 2019-07-08 MED ORDER — VIVELLE-DOT 0.05 MG/24HR TD PTTW: 1.0000 | MEDICATED_PATCH | TRANSDERMAL | 6 refills | Status: DC

## 2019-07-08 NOTE — Telephone Encounter (Signed)
Pt having trouble getting patches.

## 2019-07-14 DIAGNOSIS — Z124 Encounter for screening for malignant neoplasm of cervix: Secondary | ICD-10-CM | POA: Diagnosis not present

## 2019-07-14 DIAGNOSIS — Z6827 Body mass index (BMI) 27.0-27.9, adult: Secondary | ICD-10-CM | POA: Diagnosis not present

## 2019-07-15 ENCOUNTER — Other Ambulatory Visit (INDEPENDENT_AMBULATORY_CARE_PROVIDER_SITE_OTHER): Payer: Self-pay

## 2019-07-15 ENCOUNTER — Other Ambulatory Visit (INDEPENDENT_AMBULATORY_CARE_PROVIDER_SITE_OTHER): Payer: Self-pay | Admitting: Internal Medicine

## 2019-07-15 MED ORDER — LORAZEPAM 0.5 MG PO TABS: 0.5000 mg | ORAL_TABLET | Freq: Two times a day (BID) | ORAL | 0 refills | Status: DC | PRN

## 2019-07-15 NOTE — Telephone Encounter (Signed)
Dr Anastasio Champion called verbal order into pharmacy.

## 2019-07-15 NOTE — Telephone Encounter (Signed)
ok 

## 2019-07-23 ENCOUNTER — Other Ambulatory Visit (HOSPITAL_COMMUNITY): Payer: Self-pay | Admitting: Internal Medicine

## 2019-07-23 ENCOUNTER — Encounter (HOSPITAL_COMMUNITY): Payer: Self-pay

## 2019-07-23 ENCOUNTER — Ambulatory Visit (HOSPITAL_COMMUNITY)
Admission: RE | Admit: 2019-07-23 | Discharge: 2019-07-23 | Disposition: A | Discharge status: Home / Self Care | Payer: Medicare Other | Source: Ambulatory Visit | Attending: Internal Medicine | Admitting: Internal Medicine

## 2019-07-23 ENCOUNTER — Other Ambulatory Visit: Payer: Self-pay

## 2019-07-23 DIAGNOSIS — Z1231 Encounter for screening mammogram for malignant neoplasm of breast: Secondary | ICD-10-CM | POA: Diagnosis not present

## 2019-07-28 ENCOUNTER — Encounter (INDEPENDENT_AMBULATORY_CARE_PROVIDER_SITE_OTHER): Payer: Self-pay | Admitting: Internal Medicine

## 2019-07-28 ENCOUNTER — Other Ambulatory Visit (INDEPENDENT_AMBULATORY_CARE_PROVIDER_SITE_OTHER): Payer: Self-pay | Admitting: Internal Medicine

## 2019-07-28 MED ORDER — VIVELLE-DOT 0.075 MG/24HR TD PTTW: 1.0000 | MEDICATED_PATCH | TRANSDERMAL | 3 refills | Status: DC

## 2019-08-17 ENCOUNTER — Encounter (INDEPENDENT_AMBULATORY_CARE_PROVIDER_SITE_OTHER): Payer: Self-pay | Admitting: Internal Medicine

## 2019-08-25 ENCOUNTER — Other Ambulatory Visit (INDEPENDENT_AMBULATORY_CARE_PROVIDER_SITE_OTHER): Payer: Self-pay | Admitting: Internal Medicine

## 2019-08-25 MED ORDER — VIVELLE-DOT 0.1 MG/24HR TD PTTW: 1.0000 | MEDICATED_PATCH | TRANSDERMAL | 3 refills | Status: DC

## 2019-08-31 ENCOUNTER — Other Ambulatory Visit (INDEPENDENT_AMBULATORY_CARE_PROVIDER_SITE_OTHER): Payer: Self-pay | Admitting: Internal Medicine

## 2019-08-31 DIAGNOSIS — Z79890 Hormone replacement therapy (postmenopausal): Secondary | ICD-10-CM

## 2019-08-31 MED ORDER — PROGESTERONE MICRONIZED 200 MG PO CAPS: 200.0000 mg | ORAL_CAPSULE | Freq: Every evening | ORAL | 0 refills | Status: DC

## 2019-09-09 ENCOUNTER — Encounter (INDEPENDENT_AMBULATORY_CARE_PROVIDER_SITE_OTHER): Payer: Self-pay | Admitting: Internal Medicine

## 2019-09-09 ENCOUNTER — Other Ambulatory Visit (INDEPENDENT_AMBULATORY_CARE_PROVIDER_SITE_OTHER): Payer: Self-pay | Admitting: Internal Medicine

## 2019-09-09 MED ORDER — PROGESTERONE MICRONIZED 100 MG PO CAPS: 100.0000 mg | ORAL_CAPSULE | Freq: Every day | ORAL | 3 refills | Status: DC

## 2019-09-16 ENCOUNTER — Other Ambulatory Visit (INDEPENDENT_AMBULATORY_CARE_PROVIDER_SITE_OTHER): Payer: Self-pay | Admitting: Internal Medicine

## 2019-09-16 MED ORDER — THYROID 120 MG PO TABS: 120.0000 mg | ORAL_TABLET | Freq: Every day | ORAL | 0 refills | Status: DC

## 2019-10-17 ENCOUNTER — Encounter (INDEPENDENT_AMBULATORY_CARE_PROVIDER_SITE_OTHER): Payer: Self-pay | Admitting: Internal Medicine

## 2019-10-19 ENCOUNTER — Ambulatory Visit (INDEPENDENT_AMBULATORY_CARE_PROVIDER_SITE_OTHER): Payer: Medicare Other | Admitting: Internal Medicine

## 2019-10-21 ENCOUNTER — Ambulatory Visit (INDEPENDENT_AMBULATORY_CARE_PROVIDER_SITE_OTHER): Payer: Medicare Other | Admitting: Internal Medicine

## 2019-10-21 ENCOUNTER — Encounter (INDEPENDENT_AMBULATORY_CARE_PROVIDER_SITE_OTHER): Payer: Self-pay | Admitting: Internal Medicine

## 2019-10-21 ENCOUNTER — Other Ambulatory Visit: Payer: Self-pay

## 2019-10-21 VITALS — BP 110/70 | HR 89 | Temp 97.6°F | Resp 18 | Ht 67.0 in | Wt 168.8 lb

## 2019-10-21 DIAGNOSIS — E2839 Other primary ovarian failure: Secondary | ICD-10-CM | POA: Diagnosis not present

## 2019-10-21 DIAGNOSIS — E559 Vitamin D deficiency, unspecified: Secondary | ICD-10-CM

## 2019-10-21 DIAGNOSIS — E782 Mixed hyperlipidemia: Secondary | ICD-10-CM

## 2019-10-21 NOTE — Progress Notes (Signed)
Metrics: Intervention Frequency ACO  Documented Smoking Status Yearly  Screened one or more times in 24 months  Cessation Counseling or  Active cessation medication Past 24 months  Past 24 months   Guideline developer: UpToDate (See UpToDate for funding source) Date Released: 2014       Wellness Office Visit  Subjective:  Patient ID: Rachael Padilla, female    DOB: 1950-10-11  Age: 69 y.o. MRN: [id number]  CC: This lady comes in for follow-up of bioidentical hormone therapy, vitamin D deficiency. HPI  She is doing very well at the present time.  She is constantly exercising with swimming. She continues to take estradiol, progesterone, testosterone and desiccated NP thyroid.  She feels that the dose of estradiol still needs to be increased further.  We are on fairly high doses of estradiol patch. She continues on vitamin D3 supplementation for vitamin D deficiency. Past Medical History:  Diagnosis Date  . Decreased libido   . HLD (hyperlipidemia)   . Vitamin D deficiency disease       Family History  Problem Relation Age of Onset  . Mental illness Mother   . Dementia Mother   . Early death Father   . Emphysema Father   . Arthritis Brother   . Early death Brother   . Cirrhosis Brother   . Alcohol abuse Brother   . Breast cancer Paternal Aunt   . Colon cancer Neg Hx     Social History   Social History Narrative   Married for 21 years Lives in [place]. Moved from [place]. 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    Social History   Tobacco Use  . Smoking status: Former Smoker    Quit date: 1979    Years since quitting: 42.1  . Smokeless tobacco: Never Used  Substance Use Topics  . Alcohol use: Yes    Comment: rarely    Current Meds  Medication Sig  . Cholecalciferol (DIALYVITE VITAMIN D 5000) 125 MCG (5000 UT) capsule Take 15,000 Units by mouth daily.  Marland Kitchen LORazepam (ATIVAN) 0.5 MG tablet Take 1 tablet (0.5 mg total) by mouth 2 (two) times daily as needed for  anxiety.  . progesterone (PROMETRIUM) 200 MG capsule Take 1 capsule (200 mg total) by mouth every evening.  . Testosterone Cypionate POWD Apply 1 application topically at bedtime. Testosterone 4% Cream Compound at [employer] (1 click at night)  . thyroid (NP THYROID) 120 MG tablet Take 1 tablet (120 mg total) by mouth daily before breakfast.  . vitamin C (ASCORBIC ACID) 500 MG tablet Take 1,500 mg by mouth daily.  Marland Kitchen VIVELLE-DOT 0.1 MG/24HR patch Place 1 patch (0.1 mg total) onto the skin 2 (two) times a week.  . [DISCONTINUED] progesterone (PROMETRIUM) 100 MG capsule Take 1 capsule (100 mg total) by mouth daily.       Objective:   Today's Vitals: BP 110/70 (BP Location: Left Arm, Patient Position: Sitting, Cuff Size: Normal)   Pulse 89   Temp 97.6 F (36.4 C) (Temporal)   Resp 18   Ht 5\' 7"  (1.702 m)   Wt 168 lb 12.8 oz (76.6 kg)   SpO2 98%   BMI 26.44 kg/m  Vitals with BMI 10/21/2019 06/22/2019 04/09/2019  Height 5\' 7"  5\' 6"  -  Weight 168 lbs 13 oz 171 lbs 6 oz -  BMI [id number] 0000000 -  Systolic [id number]  Diastolic 70 70 70  Pulse 89 72 85     Physical Exam  She looks  systemically well.  She has lost another 3 pounds since the last time I saw her.  Blood pressure is excellent.  She looks very well.     Assessment   1. Vitamin D deficiency disease   2. Mixed hyperlipidemia   3. Primary ovarian failure       Tests ordered Orders Placed This Encounter  Procedures  . COMPLETE METABOLIC PANEL WITH GFR  . VITAMIN D 25 Hydroxy (Vit-D Deficiency, Fractures)  . T3, free  . TSH  . Progesterone  . Estradiol  . Testos,Total,Free and SHBG (Female)  . Lipid panel     Plan: 1. Blood work is ordered above. 2. She will continue with vitamin D3 supplementation and we will see what the levels are. 3. Her hyperlipidemia has not required statin therapy but is it been gradually improving with a combination of diet, exercise and hormonal  therapy. 4. Bioidentical hormone therapy-she will continue with all other medications and we will check the levels and see if need to adjust. 5. I will see her in 3 months time for follow-up.  I spent 30 minutes with this patient today discussing all her hormones, she had more questions regarding her thyroid today and optimal levels of this.  I think we could certainly go up higher on the dose of NP thyroid but we will see what the numbers show.   No orders of the defined types were placed in this encounter.   Doree Albee, MD

## 2019-10-22 ENCOUNTER — Encounter (INDEPENDENT_AMBULATORY_CARE_PROVIDER_SITE_OTHER): Payer: Self-pay | Admitting: Internal Medicine

## 2019-10-25 ENCOUNTER — Other Ambulatory Visit (INDEPENDENT_AMBULATORY_CARE_PROVIDER_SITE_OTHER): Payer: Self-pay | Admitting: Internal Medicine

## 2019-10-25 ENCOUNTER — Encounter (INDEPENDENT_AMBULATORY_CARE_PROVIDER_SITE_OTHER): Payer: Self-pay | Admitting: Internal Medicine

## 2019-10-25 LAB — TSH: TSH: 0.01 mIU/L — ABNORMAL LOW (ref 0.40–4.50)

## 2019-10-25 LAB — LIPID PANEL
Cholesterol: 231 mg/dL — ABNORMAL HIGH (ref <200)
HDL: 46 mg/dL — ABNORMAL LOW (ref >=50)
LDL Cholesterol (Calc): 162 mg/dL (calc) — ABNORMAL HIGH
Non-HDL Cholesterol (Calc): 185 mg/dL (calc) — ABNORMAL HIGH (ref <130)
Total CHOL/HDL Ratio: 5 (calc) — ABNORMAL HIGH (ref <5.0)
Triglycerides: 112 mg/dL (ref <150)

## 2019-10-25 LAB — VITAMIN D 25 HYDROXY (VIT D DEFICIENCY, FRACTURES): Vit D, 25-Hydroxy: 118 ng/mL — ABNORMAL HIGH (ref 30–100)

## 2019-10-25 LAB — COMPLETE METABOLIC PANEL WITH GFR
AG Ratio: 1.7 (calc) (ref 1.0–2.5)
ALT: 11 U/L (ref 6–29)
AST: 13 U/L (ref 10–35)
Albumin: 4 g/dL (ref 3.6–5.1)
Alkaline phosphatase (APISO): 60 U/L (ref 37–153)
BUN: 13 mg/dL (ref 7–25)
CO2: 26 mmol/L (ref 20–32)
Calcium: 10.4 mg/dL (ref 8.6–10.4)
Chloride: 102 mmol/L (ref 98–110)
Creat: 0.89 mg/dL (ref 0.50–0.99)
GFR, Est African American: 77 mL/min/{1.73_m2} (ref >=60)
GFR, Est Non African American: 66 mL/min/{1.73_m2} (ref >=60)
Globulin: 2.4 g/dL (calc) (ref 1.9–3.7)
Glucose, Bld: 94 mg/dL (ref 65–99)
Potassium: 4.7 mmol/L (ref 3.5–5.3)
Sodium: 137 mmol/L (ref 135–146)
Total Bilirubin: 0.5 mg/dL (ref 0.2–1.2)
Total Protein: 6.4 g/dL (ref 6.1–8.1)

## 2019-10-25 LAB — T3, FREE: T3, Free: 4.5 pg/mL — ABNORMAL HIGH (ref 2.3–4.2)

## 2019-10-25 LAB — ESTRADIOL: Estradiol: 88 pg/mL

## 2019-10-25 LAB — PROGESTERONE: Progesterone: 16.5 ng/mL

## 2019-10-25 LAB — TESTOS,TOTAL,FREE AND SHBG (FEMALE)
Free Testosterone: 8.5 pg/mL — ABNORMAL HIGH (ref 0.1–6.4)
Sex Hormone Binding: 77 nmol/L — ABNORMAL HIGH (ref 14–73)
Testosterone, Total, LC-MS-MS: 101 ng/dL — ABNORMAL HIGH (ref 2–45)

## 2019-10-25 MED ORDER — ESTRADIOL 0.1 MG/24HR TD PTTW: 1.0000 | MEDICATED_PATCH | TRANSDERMAL | 3 refills | Status: DC

## 2019-10-25 MED ORDER — THYROID 30 MG PO TABS: 30.0000 mg | ORAL_TABLET | Freq: Every day | ORAL | 3 refills | Status: DC

## 2019-10-27 ENCOUNTER — Encounter (INDEPENDENT_AMBULATORY_CARE_PROVIDER_SITE_OTHER): Payer: Self-pay | Admitting: Internal Medicine

## 2019-10-27 ENCOUNTER — Other Ambulatory Visit (INDEPENDENT_AMBULATORY_CARE_PROVIDER_SITE_OTHER): Payer: Self-pay | Admitting: Internal Medicine

## 2019-10-27 MED ORDER — NP THYROID 30 MG PO TABS: 30.0000 mg | ORAL_TABLET | Freq: Every day | ORAL | 1 refills | Status: DC

## 2019-10-27 MED ORDER — ESTRADIOL 0.1 MG/24HR TD PTWK: 0.1000 mg | MEDICATED_PATCH | TRANSDERMAL | 2 refills | Status: DC

## 2019-11-14 ENCOUNTER — Ambulatory Visit: Payer: Medicare Other | Attending: Internal Medicine

## 2019-11-14 DIAGNOSIS — Z23 Encounter for immunization: Secondary | ICD-10-CM | POA: Insufficient documentation

## 2019-11-14 NOTE — Progress Notes (Signed)
   Covid-19 Vaccination Clinic  Name:  Rachael Padilla    MRN: [id number] DOB: 01-22-1951  11/14/2019  [name] was observed post Covid-19 immunization for 15 minutes without incidence. She was provided with Vaccine Information Sheet and instruction to access the V-Safe system.   [name] was instructed to call 911 with any severe reactions post vaccine: Marland Kitchen Difficulty breathing  . Swelling of your face and throat  . A fast heartbeat  . A bad rash all over your body  . Dizziness and weakness    Immunizations Administered    Name Date Dose VIS Date Route   Pfizer COVID-19 Vaccine 11/14/2019  1:00 PM 0.3 mL 09/03/2019 Intramuscular   Manufacturer: [place]   Lot: [id number]   [place]: [id number]

## 2019-11-17 ENCOUNTER — Encounter (INDEPENDENT_AMBULATORY_CARE_PROVIDER_SITE_OTHER): Payer: Self-pay | Admitting: Internal Medicine

## 2019-11-18 NOTE — Telephone Encounter (Signed)
refill 

## 2019-11-22 ENCOUNTER — Other Ambulatory Visit (INDEPENDENT_AMBULATORY_CARE_PROVIDER_SITE_OTHER): Payer: Self-pay | Admitting: Internal Medicine

## 2019-11-22 MED ORDER — ESTRADIOL 0.1 MG/24HR TD PTWK: 0.1000 mg | MEDICATED_PATCH | TRANSDERMAL | 2 refills | Status: DC

## 2019-11-22 NOTE — Telephone Encounter (Signed)
Refill on patches at [facility];cost lower

## 2019-11-29 ENCOUNTER — Encounter (INDEPENDENT_AMBULATORY_CARE_PROVIDER_SITE_OTHER): Payer: Self-pay | Admitting: Internal Medicine

## 2019-11-30 ENCOUNTER — Other Ambulatory Visit (INDEPENDENT_AMBULATORY_CARE_PROVIDER_SITE_OTHER): Payer: Self-pay | Admitting: Internal Medicine

## 2019-11-30 MED ORDER — ESTRADIOL 0.1 MG/24HR TD PTWK: 0.1000 mg | MEDICATED_PATCH | TRANSDERMAL | 2 refills | Status: DC

## 2019-11-30 NOTE — Telephone Encounter (Signed)
Reroute Rx to [facility] only for hormone patches.

## 2019-12-08 ENCOUNTER — Ambulatory Visit: Payer: Medicare Other | Attending: Internal Medicine

## 2019-12-08 DIAGNOSIS — Z23 Encounter for immunization: Secondary | ICD-10-CM

## 2019-12-08 NOTE — Progress Notes (Signed)
   Covid-19 Vaccination Clinic  Name:  Rachael Padilla    MRN: [id number] DOB: 1950/11/26  12/08/2019  [name] was observed post Covid-19 immunization for 15 minutes without incident. She was provided with Vaccine Information Sheet and instruction to access the V-Safe system.   [name] was instructed to call 911 with any severe reactions post vaccine: Marland Kitchen Difficulty breathing  . Swelling of face and throat  . A fast heartbeat  . A bad rash all over body  . Dizziness and weakness   Immunizations Administered    Name Date Dose VIS Date Route   Pfizer COVID-19 Vaccine 12/08/2019 12:03 PM 0.3 mL 09/03/2019 Intramuscular   Manufacturer: [place]   Lot: [id number]   [place]: [id number]

## 2019-12-18 ENCOUNTER — Encounter (INDEPENDENT_AMBULATORY_CARE_PROVIDER_SITE_OTHER): Payer: Self-pay | Admitting: Internal Medicine

## 2019-12-20 ENCOUNTER — Other Ambulatory Visit (INDEPENDENT_AMBULATORY_CARE_PROVIDER_SITE_OTHER): Payer: Self-pay | Admitting: Internal Medicine

## 2019-12-20 MED ORDER — THYROID 120 MG PO TABS: 120.0000 mg | ORAL_TABLET | Freq: Every day | ORAL | 0 refills | Status: DC

## 2019-12-20 NOTE — Telephone Encounter (Signed)
Please read

## 2019-12-27 ENCOUNTER — Encounter (INDEPENDENT_AMBULATORY_CARE_PROVIDER_SITE_OTHER): Payer: Self-pay | Admitting: Internal Medicine

## 2019-12-27 DIAGNOSIS — Z79890 Hormone replacement therapy (postmenopausal): Secondary | ICD-10-CM

## 2019-12-28 MED ORDER — PROGESTERONE MICRONIZED 200 MG PO CAPS: 200.0000 mg | ORAL_CAPSULE | Freq: Every evening | ORAL | 0 refills | Status: DC

## 2020-01-18 ENCOUNTER — Ambulatory Visit: Payer: Medicare Other | Attending: Internal Medicine

## 2020-01-18 ENCOUNTER — Other Ambulatory Visit: Payer: Self-pay

## 2020-01-18 ENCOUNTER — Telehealth (INDEPENDENT_AMBULATORY_CARE_PROVIDER_SITE_OTHER): Payer: Self-pay

## 2020-01-18 DIAGNOSIS — Z20822 Contact with and (suspected) exposure to covid-19: Secondary | ICD-10-CM

## 2020-01-18 NOTE — Telephone Encounter (Signed)
Tallia agrees to go be tested today

## 2020-01-18 NOTE — Telephone Encounter (Signed)
Although COVID-19 disease is unlikely, I would agree that she does need to get tested so she should go ahead and schedule that test.  Thanks.

## 2020-01-18 NOTE — Telephone Encounter (Signed)
Rachael Padilla is stating that she has started sneezing,runny nose having chills she has congestion in her throat she has had both covid vaccines had her second vaccine on 12/08/19 she is asking should she go get tested for covid ?

## 2020-01-19 LAB — NOVEL CORONAVIRUS, NAA: SARS-CoV-2, NAA: NOT DETECTED

## 2020-01-19 LAB — SARS-COV-2, NAA 2 DAY TAT

## 2020-01-25 ENCOUNTER — Ambulatory Visit (INDEPENDENT_AMBULATORY_CARE_PROVIDER_SITE_OTHER): Payer: Medicare Other | Admitting: Internal Medicine

## 2020-02-01 ENCOUNTER — Encounter (INDEPENDENT_AMBULATORY_CARE_PROVIDER_SITE_OTHER): Payer: Self-pay | Admitting: Internal Medicine

## 2020-02-01 ENCOUNTER — Ambulatory Visit (INDEPENDENT_AMBULATORY_CARE_PROVIDER_SITE_OTHER): Payer: Medicare Other | Admitting: Internal Medicine

## 2020-02-01 ENCOUNTER — Other Ambulatory Visit: Payer: Self-pay

## 2020-02-01 VITALS — BP 116/64 | HR 66 | Temp 97.5°F | Resp 18 | Ht 67.0 in | Wt 163.8 lb

## 2020-02-01 DIAGNOSIS — E2839 Other primary ovarian failure: Secondary | ICD-10-CM | POA: Diagnosis not present

## 2020-02-01 DIAGNOSIS — E782 Mixed hyperlipidemia: Secondary | ICD-10-CM | POA: Diagnosis not present

## 2020-02-01 DIAGNOSIS — M545 Low back pain: Secondary | ICD-10-CM

## 2020-02-01 DIAGNOSIS — G8929 Other chronic pain: Secondary | ICD-10-CM

## 2020-02-01 DIAGNOSIS — R6882 Decreased libido: Secondary | ICD-10-CM | POA: Diagnosis not present

## 2020-02-01 LAB — LIPID PANEL
Cholesterol: 234 mg/dL — ABNORMAL HIGH (ref <200)
HDL: 48 mg/dL — ABNORMAL LOW (ref >=50)
LDL Cholesterol (Calc): 163 mg/dL (calc) — ABNORMAL HIGH
Non-HDL Cholesterol (Calc): 186 mg/dL (calc) — ABNORMAL HIGH (ref <130)
Total CHOL/HDL Ratio: 4.9 (calc) (ref <5.0)
Triglycerides: 110 mg/dL (ref <150)

## 2020-02-01 LAB — T3, FREE: T3, Free: 6.3 pg/mL — ABNORMAL HIGH (ref 2.3–4.2)

## 2020-02-01 NOTE — Progress Notes (Signed)
Metrics: Intervention Frequency ACO  Documented Smoking Status Yearly  Screened one or more times in 24 months  Cessation Counseling or  Active cessation medication Past 24 months  Past 24 months   Guideline developer: UpToDate (See UpToDate for funding source) Date Released: 2014       Wellness Office Visit  Subjective:  Patient ID: Rachael Padilla, female    DOB: May 26, 1951  Age: 69 y.o. MRN: [id number]  CC: This lady comes in for follow-up of hyperlipidemia, vitamin D deficiency, symptoms of thyroid deficiency, postmenopausal state and hormone replacement therapy. HPI  She is doing extremely well.  We increased her desiccated NP thyroid on the last visit and she has tolerated this well without any problems. She continues to swim almost on a daily basis and feels very good. She does complain of low back pain which she has had for several years and it does tend to radiate posteriorly down both her legs.  It does not disable her but she has a high tolerance for pain. Past Medical History:  Diagnosis Date  . Decreased libido   . HLD (hyperlipidemia)   . Vitamin D deficiency disease       Family History  Problem Relation Age of Onset  . Mental illness Mother   . Dementia Mother   . Early death Father   . Emphysema Father   . Arthritis Brother   . Early death Brother   . Cirrhosis Brother   . Alcohol abuse Brother   . Breast cancer Paternal Aunt   . Colon cancer Neg Hx     Social History   Social History Narrative   Married for 21 years Lives in [place]. Moved from [place]. 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    Social History   Tobacco Use  . Smoking status: Former Smoker    Quit date: 1979    Years since quitting: 42.3  . Smokeless tobacco: Never Used  Substance Use Topics  . Alcohol use: Yes    Comment: rarely    Current Meds  Medication Sig  . Cholecalciferol (DIALYVITE VITAMIN D 5000) 125 MCG (5000 UT) capsule Take 15,000 Units by mouth daily.  Marland Kitchen  estradiol (CLIMARA - DOSED IN MG/24 HR) 0.1 mg/24hr patch Place 1 patch (0.1 mg total) onto the skin 2 (two) times a week.  Marland Kitchen LORazepam (ATIVAN) 0.5 MG tablet Take 1 tablet (0.5 mg total) by mouth 2 (two) times daily as needed for anxiety.  . NP THYROID 30 MG tablet Take 1 tablet (30 mg total) by mouth daily before breakfast.  . progesterone (PROMETRIUM) 200 MG capsule Take 1 capsule (200 mg total) by mouth every evening.  . Testosterone Cypionate POWD Apply 1 application topically at bedtime. Testosterone 4% Cream Compound at [employer] (1 click at night)  . thyroid (NP THYROID) 120 MG tablet Take 1 tablet (120 mg total) by mouth daily before breakfast.  . vitamin C (ASCORBIC ACID) 500 MG tablet Take 1,500 mg by mouth daily.      Objective:   Today's Vitals: BP 116/64 (BP Location: Right Arm, Patient Position: Sitting, Cuff Size: Normal)   Pulse 66   Temp (!) 97.5 F (36.4 C) (Temporal)   Resp 18   Ht 5\' 7"  (1.702 m)   Wt 163 lb 12.8 oz (74.3 kg)   SpO2 98%   BMI 25.65 kg/m  Vitals with BMI 02/01/2020 10/21/2019 06/22/2019  Height 5\' 7"  5\' 7"  5\' 6"   Weight 163 lbs 13 oz 168 lbs  13 oz 171 lbs 6 oz  BMI 25.65 [id number]  Systolic [id number]  Diastolic 64 70 70  Pulse 66 89 72     Physical Exam  She looks systemically well.  She has lost a further 5 pounds in weight since last visit just over 3 months ago.  She looks much younger than her stated age.     Assessment   1. Decreased libido   2. Mixed hyperlipidemia   3. Primary ovarian failure   4. Chronic bilateral low back pain without sciatica       Tests ordered Orders Placed This Encounter  Procedures  . MR Lumbar Spine Wo Contrast  . Lipid panel  . T3, free     Plan: 1. Blood work is ordered. 2. For her chronic low back pain, we will try and get MRI lumbar spine, providing insurance approves this. 3. Otherwise, she will continue with bioidentical hormone therapy as above and further  recommendations will depend on blood results. 4. Today I spent 30 minutes with this patient discussing all her hormones again and she has several questions regarding this as well as addressing her low back pain.   No orders of the defined types were placed in this encounter.   Doree Albee, MD

## 2020-02-02 ENCOUNTER — Encounter (INDEPENDENT_AMBULATORY_CARE_PROVIDER_SITE_OTHER): Payer: Self-pay | Admitting: Internal Medicine

## 2020-02-28 ENCOUNTER — Ambulatory Visit (HOSPITAL_COMMUNITY): Payer: Medicare Other

## 2020-02-29 ENCOUNTER — Other Ambulatory Visit: Payer: Self-pay

## 2020-02-29 ENCOUNTER — Ambulatory Visit (INDEPENDENT_AMBULATORY_CARE_PROVIDER_SITE_OTHER): Payer: Medicare Other | Admitting: Internal Medicine

## 2020-02-29 ENCOUNTER — Encounter (INDEPENDENT_AMBULATORY_CARE_PROVIDER_SITE_OTHER): Payer: Self-pay | Admitting: Internal Medicine

## 2020-02-29 VITALS — BP 110/66 | HR 89 | Temp 97.6°F | Resp 18 | Ht 67.0 in | Wt 162.8 lb

## 2020-02-29 DIAGNOSIS — E782 Mixed hyperlipidemia: Secondary | ICD-10-CM | POA: Diagnosis not present

## 2020-02-29 DIAGNOSIS — R5381 Other malaise: Secondary | ICD-10-CM

## 2020-02-29 DIAGNOSIS — M19041 Primary osteoarthritis, right hand: Secondary | ICD-10-CM | POA: Diagnosis not present

## 2020-02-29 DIAGNOSIS — R5383 Other fatigue: Secondary | ICD-10-CM | POA: Diagnosis not present

## 2020-02-29 DIAGNOSIS — M19042 Primary osteoarthritis, left hand: Secondary | ICD-10-CM | POA: Diagnosis not present

## 2020-02-29 NOTE — Progress Notes (Signed)
Metrics: Intervention Frequency ACO  Documented Smoking Status Yearly  Screened one or more times in 24 months  Cessation Counseling or  Active cessation medication Past 24 months  Past 24 months   Guideline developer: UpToDate (See UpToDate for funding source) Date Released: 2014       Wellness Office Visit  Subjective:  Patient ID: Rachael Padilla, female    DOB: February 21, 1951  Age: 69 y.o. MRN: [id number]  CC: This delightful lady comes in for follow-up regarding her hyperlipidemia, symptoms relating to thyroid deficiency. HPI  On the last visit, her T3 was at optimal levels but she was still somewhat symptomatic so we increased her NP thyroid so that she is taking a dose of 180 mg every morning.  She is tolerating this well. Today she is complaining of bilateral hand joint stiffness/pain.  She denies noticing any major swelling of the joints in her hands but this is a concern for her in the last few weeks.  This appears to be a new symptom for my recollection.  She does not have any joint problems in any other parts of her body. She continues to swim on a regular basis and now is going to start a plant-based diet. Past Medical History:  Diagnosis Date  . Decreased libido   . HLD (hyperlipidemia)   . Vitamin D deficiency disease    Past Surgical History:  Procedure Laterality Date  . APPENDECTOMY     early 30s  . AUGMENTATION MAMMAPLASTY    . BREAST SURGERY     implants- early 2000  . COLONOSCOPY N/A 04/09/2019   Procedure: COLONOSCOPY;  Surgeon: Danie Binder, MD;  [facility];  Service: Endoscopy;  Laterality: N/A;  9:30am  . HEMORRHOID BANDING N/A 04/09/2019   Procedure: Thayer Jew;  Surgeon: Danie Binder, MD;  Location: [facility];  Service: Endoscopy;  Laterality: N/A;     Family History  Problem Relation Age of Onset  . Mental illness Mother   . Dementia Mother   . Early death Father   . Emphysema Father   . Arthritis Brother   .  Early death Brother   . Cirrhosis Brother   . Alcohol abuse Brother   . Breast cancer Paternal Aunt   . Colon cancer Neg Hx     Social History   Social History Narrative   Married for 21 years Lives in [place]. Moved from [place]. 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    Social History   Tobacco Use  . Smoking status: Former Smoker    Quit date: 1979    Years since quitting: 42.4  . Smokeless tobacco: Never Used  Substance Use Topics  . Alcohol use: Yes    Comment: rarely    Current Meds  Medication Sig  . Cholecalciferol (DIALYVITE VITAMIN D 5000) 125 MCG (5000 UT) capsule Take 15,000 Units by mouth daily.  Marland Kitchen estradiol (CLIMARA - DOSED IN MG/24 HR) 0.1 mg/24hr patch Place 1 patch (0.1 mg total) onto the skin 2 (two) times a week.  . NP THYROID 30 MG tablet Take 1 tablet (30 mg total) by mouth daily before breakfast.  . progesterone (PROMETRIUM) 200 MG capsule Take 1 capsule (200 mg total) by mouth every evening.  . Testosterone Cypionate POWD Apply 1 application topically at bedtime. Testosterone 4% Cream Compound at [employer] (1 click at night)  . thyroid (NP THYROID) 120 MG tablet Take 1 tablet (120 mg total) by mouth daily before breakfast.  .  vitamin C (ASCORBIC ACID) 500 MG tablet Take 1,500 mg by mouth daily.      Depression screen [facility] 2/9 02/29/2020 04/30/2018 09/26/2017 08/20/2017 07/17/2017  Decreased Interest 0 0 0 0 1  Down, Depressed, Hopeless 0 0 0 0 3  PHQ - 2 Score 0 0 0 0 4  Altered sleeping - - - - 1  Tired, decreased energy - - - - 2  Change in appetite - - - - 0  Feeling bad or failure about yourself  - - - - 0  Trouble concentrating - - - - 0  Moving slowly or fidgety/restless - - - - 0  Suicidal thoughts - - - - 0  PHQ-9 Score - - - - 7  Difficult doing work/chores - - - - Somewhat difficult     Objective:   Today's Vitals: BP 110/66 (BP Location: Right Arm, Patient Position: Sitting, Cuff Size: Normal)   Pulse 89   Temp 97.6  F (36.4 C) (Temporal)   Resp 18   Ht 5\' 7"  (1.702 m)   Wt 162 lb 12.8 oz (73.8 kg)   SpO2 98%   BMI 25.50 kg/m  Vitals with BMI 02/29/2020 02/01/2020 10/21/2019  Height 5\' 7"  5\' 7"  5\' 7"   Weight 162 lbs 13 oz 163 lbs 13 oz 168 lbs 13 oz  BMI 25.49 33.29 51.88  Systolic 416 606 301  Diastolic 66 64 70  Pulse 89 66 89     Physical Exam   She looks well.  Her weight is very stable.  Blood pressure is excellent.  I cannot see any obvious arthritic changes in her joints of her hands.    Assessment   1. Mixed hyperlipidemia   2. Malaise and fatigue   3. Arthritis of both hands       Tests ordered Orders Placed This Encounter  Procedures  . T3, free  . ANA,IFA RA Diag Pnl w/rflx Tit/Patn     Plan: 1. We will check her free T3 levels today.  We will see if we need to change the dose of her NP thyroid further or not. 2. We will check arthritic panel with rheumatoid arthritis diagnostic panel and ANA levels. 3. I will see her in about 2 months time to see how she is doing and I have told her to come fasting that day so we can do a cardio IQ lipid panel with inflammatory markers.   No orders of the defined types were placed in this encounter.   Doree Albee, MD

## 2020-03-03 LAB — ANTI-NUCLEAR AB-TITER (ANA TITER)
ANA TITER: 1:160 {titer} — ABNORMAL HIGH
ANA Titer 1: 1:80 {titer} — ABNORMAL HIGH

## 2020-03-03 LAB — T3, FREE: T3, Free: 6.3 pg/mL — ABNORMAL HIGH (ref 2.3–4.2)

## 2020-03-03 LAB — ANA,IFA RA DIAG PNL W/RFLX TIT/PATN
Anti Nuclear Antibody (ANA): POSITIVE — AB
Cyclic Citrullin Peptide Ab: <16 UNITS
Rheumatoid fact SerPl-aCnc: <14 IU/mL (ref <14)

## 2020-03-06 ENCOUNTER — Other Ambulatory Visit (INDEPENDENT_AMBULATORY_CARE_PROVIDER_SITE_OTHER): Payer: Self-pay | Admitting: Internal Medicine

## 2020-03-06 ENCOUNTER — Encounter (INDEPENDENT_AMBULATORY_CARE_PROVIDER_SITE_OTHER): Payer: Self-pay | Admitting: Internal Medicine

## 2020-03-06 DIAGNOSIS — M19041 Primary osteoarthritis, right hand: Secondary | ICD-10-CM

## 2020-03-06 DIAGNOSIS — M19042 Primary osteoarthritis, left hand: Secondary | ICD-10-CM

## 2020-03-06 NOTE — Progress Notes (Signed)
Called lvm of instructions.

## 2020-03-06 NOTE — Progress Notes (Signed)
Please let the patient know that thyroid tests are great so continue with the same dose of NP thyroid.The test for arthritis are somewhat positive, she does not appear to have rheumatoid arthritis though, and I want her to see a rheumatologist and I will write an order for the referral.

## 2020-03-15 ENCOUNTER — Other Ambulatory Visit (INDEPENDENT_AMBULATORY_CARE_PROVIDER_SITE_OTHER): Payer: Self-pay | Admitting: Internal Medicine

## 2020-03-15 DIAGNOSIS — Z79890 Hormone replacement therapy (postmenopausal): Secondary | ICD-10-CM

## 2020-03-16 ENCOUNTER — Other Ambulatory Visit (INDEPENDENT_AMBULATORY_CARE_PROVIDER_SITE_OTHER): Payer: Self-pay | Admitting: Internal Medicine

## 2020-03-18 ENCOUNTER — Encounter (INDEPENDENT_AMBULATORY_CARE_PROVIDER_SITE_OTHER): Payer: Self-pay | Admitting: Internal Medicine

## 2020-03-20 ENCOUNTER — Other Ambulatory Visit (INDEPENDENT_AMBULATORY_CARE_PROVIDER_SITE_OTHER): Payer: Self-pay | Admitting: Internal Medicine

## 2020-03-20 MED ORDER — THYROID 120 MG PO TABS: ORAL_TABLET | ORAL | 0 refills | Status: DC

## 2020-03-23 ENCOUNTER — Encounter: Payer: Self-pay | Admitting: Podiatry

## 2020-03-23 ENCOUNTER — Other Ambulatory Visit: Payer: Self-pay

## 2020-03-23 ENCOUNTER — Ambulatory Visit (INDEPENDENT_AMBULATORY_CARE_PROVIDER_SITE_OTHER): Payer: Medicare Other | Admitting: Podiatry

## 2020-03-23 ENCOUNTER — Ambulatory Visit (INDEPENDENT_AMBULATORY_CARE_PROVIDER_SITE_OTHER): Payer: Medicare Other

## 2020-03-23 DIAGNOSIS — M7742 Metatarsalgia, left foot: Secondary | ICD-10-CM | POA: Diagnosis not present

## 2020-03-23 DIAGNOSIS — M79671 Pain in right foot: Secondary | ICD-10-CM | POA: Diagnosis not present

## 2020-03-23 DIAGNOSIS — M7751 Other enthesopathy of right foot: Secondary | ICD-10-CM

## 2020-03-23 DIAGNOSIS — M79672 Pain in left foot: Secondary | ICD-10-CM | POA: Diagnosis not present

## 2020-03-23 DIAGNOSIS — M2041 Other hammer toe(s) (acquired), right foot: Secondary | ICD-10-CM | POA: Diagnosis not present

## 2020-03-23 DIAGNOSIS — M7750 Other enthesopathy of unspecified foot: Secondary | ICD-10-CM

## 2020-03-23 DIAGNOSIS — M7741 Metatarsalgia, right foot: Secondary | ICD-10-CM

## 2020-03-23 DIAGNOSIS — M2011 Hallux valgus (acquired), right foot: Secondary | ICD-10-CM

## 2020-03-23 NOTE — Patient Instructions (Addendum)
Comfortable shoe brands I have found with other patients include: Vionic (especially for sandals and casual shoes), [employer] One-One for sneakers/runners   Pre made orthotics that are helpful include: Protalus, Emsold  Bunion splints can be found on [employer]

## 2020-03-24 NOTE — Progress Notes (Signed)
  Subjective:  Patient ID: Rachael Padilla, female    DOB: 1951-09-20,  MRN: [id number]  Chief Complaint  Patient presents with  . Foot Pain    bilateral bottoms of feet and top of right foot    69 y.o. female presents with the above complaint. History confirmed with patient.  She has a right foot bunion and a hammertoe of the right fourth toe, that is occasionally painful for her.  She noticed sharp pain in the dorsal midfoot on the right foot middle the night 2 weeks ago, resolved spontaneously and has not been a problem since.  She swims every day for exercise.  She is asking about shoe recommendations of specific brands to be comfortable and good for her feet.  Objective:  Physical Exam: warm, good capillary refill, no trophic changes or ulcerative lesions, normal DP and PT pulses and normal sensory exam. Left Foot: bunion deformity noted and normal microcirculation and capillary reflow  Right Foot: normal exam, no swelling, tenderness, instability; ligaments intact, full range of motion of all ankle/foot joints   No images are attached to the encounter.  Radiographs: X-ray of the right foot: 3 weightbearing views of the right foot hallux valgus deformity and digital contractures Assessment:   1. Pain in both feet   2. Hallux valgus (acquired), right foot   3. Hammertoe of right foot      Plan:  Patient was evaluated and treated and all questions answered.  Bunion, Hammertoe and Metatarsalgia -XR reviewed with patient -Educated on etiology of deformity -Discussed proper shoe gear modifications and padding, including a number of different types of shoe gear that she may find helpful -We also discussed intrinsic muscle strengthening and exercises to relieve some of the foot pain that she may have been having. -We discussed custom and prefabricated orthotics and the benefits and disadvantages of both of these.  She will try some premade orthotics such as Protalus or  Emsolds -We also discussed bunion splints and there ability to stretch the Ligaments of the First Metatarsal. -We Also Discussed Surgical Intervention, the Indications for This, Potential Procedures, and Expected Postoperative Course for Both of These.  She Will Try Conservative Therapy First.  A total of 50 minutes was spent today on the review of the patients medical record including imaging studies, taking of the history, examining the patient, and documentation in the chart.   Return in about 6 months (around 09/23/2020), or if symptoms worsen or fail to improve.

## 2020-03-27 ENCOUNTER — Encounter (INDEPENDENT_AMBULATORY_CARE_PROVIDER_SITE_OTHER): Payer: Self-pay | Admitting: Internal Medicine

## 2020-03-28 ENCOUNTER — Other Ambulatory Visit (INDEPENDENT_AMBULATORY_CARE_PROVIDER_SITE_OTHER): Payer: Self-pay | Admitting: Internal Medicine

## 2020-03-28 MED ORDER — FIRST-TESTOSTERONE MC 2 % TD CREA
5.0000 mg | TOPICAL_CREAM | Freq: Every day | TRANSDERMAL | 0 refills | Status: AC
Start: 2020-03-28 — End: ?

## 2020-03-28 NOTE — Telephone Encounter (Signed)
refill 

## 2020-04-06 ENCOUNTER — Ambulatory Visit (INDEPENDENT_AMBULATORY_CARE_PROVIDER_SITE_OTHER): Payer: Medicare Other | Admitting: Internal Medicine

## 2020-04-12 ENCOUNTER — Encounter (INDEPENDENT_AMBULATORY_CARE_PROVIDER_SITE_OTHER): Payer: Self-pay | Admitting: Internal Medicine

## 2020-04-13 ENCOUNTER — Other Ambulatory Visit (INDEPENDENT_AMBULATORY_CARE_PROVIDER_SITE_OTHER): Payer: Self-pay | Admitting: Internal Medicine

## 2020-04-13 MED ORDER — NP THYROID 120 MG PO TABS: ORAL_TABLET | ORAL | 1 refills | Status: DC

## 2020-04-27 ENCOUNTER — Other Ambulatory Visit (INDEPENDENT_AMBULATORY_CARE_PROVIDER_SITE_OTHER): Payer: Self-pay | Admitting: Internal Medicine

## 2020-04-27 NOTE — Telephone Encounter (Signed)
Rachael Padilla states that she is only taking the NP Thyroid 120mg  , so she don't need any refill at this time

## 2020-04-27 NOTE — Telephone Encounter (Signed)
Will you call and ask this patient if she takes both the 120mg  and 30mg  tablets of NP thyroid daily? I received a Rx request for the 30mg  tablets, but I see she also is prescribed the 120mg  tablets. Thank you.

## 2020-05-01 ENCOUNTER — Telehealth (INDEPENDENT_AMBULATORY_CARE_PROVIDER_SITE_OTHER): Payer: Self-pay

## 2020-05-01 NOTE — Telephone Encounter (Signed)
She already has an appointment to see me in the next 2 days so I think we can discuss everything then.

## 2020-05-01 NOTE — Telephone Encounter (Signed)
OK 

## 2020-05-03 ENCOUNTER — Encounter (INDEPENDENT_AMBULATORY_CARE_PROVIDER_SITE_OTHER): Payer: Self-pay | Admitting: Internal Medicine

## 2020-05-03 ENCOUNTER — Other Ambulatory Visit: Payer: Self-pay

## 2020-05-03 ENCOUNTER — Ambulatory Visit (INDEPENDENT_AMBULATORY_CARE_PROVIDER_SITE_OTHER): Payer: Medicare Other | Admitting: Internal Medicine

## 2020-05-03 VITALS — BP 115/65 | HR 79 | Temp 97.3°F | Ht 67.0 in | Wt 159.0 lb

## 2020-05-03 DIAGNOSIS — E782 Mixed hyperlipidemia: Secondary | ICD-10-CM | POA: Diagnosis not present

## 2020-05-03 NOTE — Progress Notes (Signed)
Metrics: Intervention Frequency ACO  Documented Smoking Status Yearly  Screened one or more times in 24 months  Cessation Counseling or  Active cessation medication Past 24 months  Past 24 months   Guideline developer: UpToDate (See UpToDate for funding source) Date Released: 2014       Wellness Office Visit  Subjective:  Patient ID: Rachael Padilla, female    DOB: [birth year]  Age: 69 y.o. MRN: [id number]  CC: This lady comes in for follow-up of hyperlipidemia. HPI  She has been doing well extremely with diet and exercise and continues to lose weight. She had an issue with hair growth so she reduced her NP thyroid 220 mg daily and I sent her a lower dose of testosterone at 5 mg daily applied to the labia.  She was up to 10 mg daily I believe. Past Medical History:  Diagnosis Date  . Decreased libido   . HLD (hyperlipidemia)   . Vitamin D deficiency disease    Past Surgical History:  Procedure Laterality Date  . APPENDECTOMY     early 63s  . AUGMENTATION MAMMAPLASTY    . BREAST SURGERY     implants- early 2000  . COLONOSCOPY N/A 04/09/2019   Procedure: COLONOSCOPY;  Surgeon: Danie Binder, MD;  [facility];  Service: Endoscopy;  Laterality: N/A;  9:30am  . HEMORRHOID BANDING N/A 04/09/2019   Procedure: Thayer Jew;  Surgeon: Danie Binder, MD;  [facility];  Service: Endoscopy;  Laterality: N/A;     Family History  Problem Relation Age of Onset  . Mental illness Mother   . Dementia Mother   . Early death Father   . Emphysema Father   . Arthritis Brother   . Early death Brother   . Cirrhosis Brother   . Alcohol abuse Brother   . Breast cancer Paternal Aunt   . Colon cancer Neg Hx     Social History   Social History Narrative   Married for 21 years Lives in [place]. Moved from [place]. 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    Social History   Tobacco Use  . Smoking status: Former Smoker    Quit date: 1979    Years since  quitting: 42.6  . Smokeless tobacco: Never Used  Substance Use Topics  . Alcohol use: Yes    Comment: rarely    Current Meds  Medication Sig  . Cholecalciferol (DIALYVITE VITAMIN D 5000) 125 MCG (5000 UT) capsule Take 15,000 Units by mouth daily.  Marland Kitchen estradiol (CLIMARA - DOSED IN MG/24 HR) 0.1 mg/24hr patch Place 1 patch (0.1 mg total) onto the skin 2 (two) times a week.  . estradiol (VIVELLE-DOT) 0.1 MG/24HR patch 1 patch 2 (two) times a week.  . Influenza Vac High-Dose Quad (FLUZONE HIGH-DOSE QUADRIVALENT) 0.7 ML SUSY Fluzone High-Dose Quad 2020-21 (PF) 240 mcg/0.7 mL IM syringe  PHARMACIST ADMINISTERED IMMUNIZATION ADMINISTERED AT TIME OF DISPENSING  . LORazepam (ATIVAN) 0.5 MG tablet Take 1 tablet (0.5 mg total) by mouth 2 (two) times daily as needed for anxiety.  . NP THYROID 120 MG tablet TAKE 1 TABLET (120 MG TOTAL) BY MOUTH DAILY BEFORE BREAKFAST.  . progesterone (PROMETRIUM) 200 MG capsule TAKE ONE CAPSULE BY MOUTH EVERY EVENING  . Testosterone Propionate (FIRST-TESTOSTERONE MC) 2 % CREA Place 5 mg onto the skin daily.  . vitamin C (ASCORBIC ACID) 500 MG tablet Take 1,500 mg by mouth daily.       Depression screen [facility] 2/9 02/29/2020 04/30/2018 09/26/2017  08/20/2017 07/17/2017  Decreased Interest 0 0 0 0 1  Down, Depressed, Hopeless 0 0 0 0 3  PHQ - 2 Score 0 0 0 0 4  Altered sleeping - - - - 1  Tired, decreased energy - - - - 2  Change in appetite - - - - 0  Feeling bad or failure about yourself  - - - - 0  Trouble concentrating - - - - 0  Moving slowly or fidgety/restless - - - - 0  Suicidal thoughts - - - - 0  PHQ-9 Score - - - - 7  Difficult doing work/chores - - - - Somewhat difficult     Objective:   Today's Vitals: BP 115/65 (BP Location: Left Arm, Patient Position: Sitting, Cuff Size: Normal)   Pulse 79   Temp (!) 97.3 F (36.3 C) (Temporal)   Ht 5\' 7"  (1.702 m)   Wt 159 lb (72.1 kg)   SpO2 97%   BMI 24.90 kg/m  Vitals with BMI 05/03/2020 02/29/2020 02/01/2020    Height 5\' 7"  5\' 7"  5\' 7"   Weight 159 lbs 162 lbs 13 oz 163 lbs 13 oz  BMI 24.9 21.22 48.25  Systolic 003 704 888  Diastolic 65 66 64  Pulse 79 89 66     Physical Exam  She looks systemically well.  She has lost a further 3 pounds since the last time I saw her.  Blood pressure is excellent.     Assessment   1. Mixed hyperlipidemia       Tests ordered Orders Placed This Encounter  Procedures  . Cardio IQ Adv Lipid and Inflamm Pnl     Plan: 1. We will check a cardio IQ lipid panel with inflammatory markers.  I will let her know the results. 2. Follow-up in 3 months for an annual physical exam.   No orders of the defined types were placed in this encounter.   Doree Albee, MD

## 2020-05-07 LAB — CARDIO IQ ADV LIPID AND INFLAMM PNL
Apolipoprotein B: 128 mg/dL — ABNORMAL HIGH (ref <90)
Cholesterol: 228 mg/dL — ABNORMAL HIGH (ref <200)
HDL: 49 mg/dL — ABNORMAL LOW (ref >49)
LDL Cholesterol (Calc): 160 mg/dL (calc) — ABNORMAL HIGH (ref <100)
LDL Large: 5790 nmol/L — ABNORMAL LOW (ref >6729)
LDL Medium: 499 nmol/L — ABNORMAL HIGH (ref <215)
LDL Particle Number: 2164 nmol/L — ABNORMAL HIGH (ref <1138)
LDL Peak Size: 221.7 Angstrom — ABNORMAL LOW (ref >222.9)
LDL Small: 262 nmol/L — ABNORMAL HIGH (ref <142)
Lipoprotein (a): 221 nmol/L — ABNORMAL HIGH (ref <75)
Non-HDL Cholesterol (Calc): 179 mg/dL (calc) — ABNORMAL HIGH (ref <130)
PLAC: 136 nmol/min/mL — ABNORMAL HIGH (ref <124)
Total CHOL/HDL Ratio: 4.7 calc — ABNORMAL HIGH (ref <3.6)
Triglycerides: 89 mg/dL (ref <150)
hs-CRP: 1.3 mg/L — ABNORMAL HIGH (ref <1.0)

## 2020-06-17 ENCOUNTER — Other Ambulatory Visit (INDEPENDENT_AMBULATORY_CARE_PROVIDER_SITE_OTHER): Payer: Self-pay | Admitting: Internal Medicine

## 2020-06-19 ENCOUNTER — Other Ambulatory Visit (INDEPENDENT_AMBULATORY_CARE_PROVIDER_SITE_OTHER): Payer: Self-pay | Admitting: Nurse Practitioner

## 2020-06-19 ENCOUNTER — Encounter (INDEPENDENT_AMBULATORY_CARE_PROVIDER_SITE_OTHER): Payer: Self-pay | Admitting: Internal Medicine

## 2020-06-19 DIAGNOSIS — Z79890 Hormone replacement therapy (postmenopausal): Secondary | ICD-10-CM

## 2020-06-19 MED ORDER — PROGESTERONE 200 MG PO CAPS: 200.0000 mg | ORAL_CAPSULE | Freq: Every evening | ORAL | 0 refills | Status: DC

## 2020-06-19 MED ORDER — ESTRADIOL 0.1 MG/24HR TD PTTW: 1.0000 | MEDICATED_PATCH | TRANSDERMAL | 1 refills | Status: DC

## 2020-06-22 ENCOUNTER — Other Ambulatory Visit: Payer: Self-pay

## 2020-06-22 ENCOUNTER — Ambulatory Visit (INDEPENDENT_AMBULATORY_CARE_PROVIDER_SITE_OTHER): Payer: Medicare Other | Admitting: Rheumatology

## 2020-06-22 ENCOUNTER — Ambulatory Visit: Payer: Self-pay

## 2020-06-22 ENCOUNTER — Encounter: Payer: Self-pay | Admitting: Rheumatology

## 2020-06-22 VITALS — BP 112/71 | HR 80 | Resp 14 | Ht 66.0 in | Wt 163.0 lb

## 2020-06-22 DIAGNOSIS — Z13820 Encounter for screening for osteoporosis: Secondary | ICD-10-CM

## 2020-06-22 DIAGNOSIS — M79642 Pain in left hand: Secondary | ICD-10-CM | POA: Diagnosis not present

## 2020-06-22 DIAGNOSIS — M79641 Pain in right hand: Secondary | ICD-10-CM | POA: Diagnosis not present

## 2020-06-22 DIAGNOSIS — G8929 Other chronic pain: Secondary | ICD-10-CM | POA: Diagnosis not present

## 2020-06-22 DIAGNOSIS — Z8639 Personal history of other endocrine, nutritional and metabolic disease: Secondary | ICD-10-CM | POA: Diagnosis not present

## 2020-06-22 DIAGNOSIS — E559 Vitamin D deficiency, unspecified: Secondary | ICD-10-CM | POA: Diagnosis not present

## 2020-06-22 DIAGNOSIS — Z9229 Personal history of other drug therapy: Secondary | ICD-10-CM

## 2020-06-22 DIAGNOSIS — M25561 Pain in right knee: Secondary | ICD-10-CM

## 2020-06-22 DIAGNOSIS — M8589 Other specified disorders of bone density and structure, multiple sites: Secondary | ICD-10-CM | POA: Diagnosis not present

## 2020-06-22 NOTE — Progress Notes (Signed)
Office Visit Note  Patient: Rachael Padilla             Date of Birth: 1950-12-15           MRN: [id number]             PCP: Doree Albee, MD Referring: Doree Albee, MD Visit Date: 06/22/2020 Occupation: @GUAROCC @  Subjective:  Pain in both hands.   History of Present Illness: Rachael Padilla is a 69 y.o. female seen in consultation per request of her PCP.  According the patient for the last 6 months she has been having pain and stiffness in her bilateral hands.  She states Monday she woke up and she started feeling popping sensation in her hands.  She feels stiffness every morning.  She describes the pain mostly in her DIP joints.  She has not noticed any joint swelling.  She is changing her diet and is mostly on plant-based diet now.  She states she injured her right knee joint about 6 years ago and has some off-and-on discomfort in her right knee joint.  She has not had discomfort in any other joints.  She denies any history of oral ulcers, nasal ulcers, malar rash, photosensitivity or Raynaud's phenomenon.  He has no family history of autoimmune disease.  Patient states that she had a DEXA scan in [place] which showed osteopenia.  She also has history of vitamin D deficiency and she has been taking vitamin D.  Activities of Daily Living:  Patient reports morning stiffness for  A few minutes.   Patient Denies nocturnal pain.  Difficulty dressing/grooming: Denies Difficulty climbing stairs: Denies Difficulty getting out of chair: Denies Difficulty using hands for taps, buttons, cutlery, and/or writing: Denies  Review of Systems  Constitutional: Negative for fatigue.  HENT: Negative for mouth sores, mouth dryness and nose dryness.   Eyes: Negative for pain, itching and dryness.  Respiratory: Negative for shortness of breath, wheezing and difficulty breathing.   Cardiovascular: Negative for chest pain and palpitations.  Gastrointestinal: Negative for blood in  stool, constipation and diarrhea.  Endocrine: Negative for increased urination.  Genitourinary: Positive for pelvic pain. Negative for difficulty urinating and painful urination.  Musculoskeletal: Positive for arthralgias, joint pain and morning stiffness. Negative for joint swelling, myalgias, muscle tenderness and myalgias.  Skin: Negative for color change, rash and redness.  Allergic/Immunologic: Negative for susceptible to infections.  Neurological: Negative for dizziness, numbness, headaches, memory loss and weakness.  Hematological: Negative for bruising/bleeding tendency.  Psychiatric/Behavioral: Negative for confusion and sleep disturbance.    PMFS History:  Patient Active Problem List   Diagnosis Date Noted  . Vitamin D deficiency disease   . HLD (hyperlipidemia)   . Decreased libido   . Constipation 09/02/2018  . Hemorrhoids 09/02/2018  . Positive colorectal cancer screening using Cologuard test 09/02/2018  . Urinary tract infectious disease 02/09/2018  . History of postmenopausal HRT 07/17/2017    Past Medical History:  Diagnosis Date  . Decreased libido   . HLD (hyperlipidemia)   . Vitamin D deficiency disease     Family History  Problem Relation Age of Onset  . Mental illness Mother   . Dementia Mother   . Early death Father   . Emphysema Father   . Healthy Sister   . Arthritis Brother   . Healthy Daughter   . Healthy Daughter   . Early death Brother   . Cirrhosis Brother   . Alcohol abuse Brother   .  Breast cancer Paternal Aunt   . Colon cancer Neg Hx    Past Surgical History:  Procedure Laterality Date  . APPENDECTOMY     early 29s  . AUGMENTATION MAMMAPLASTY    . BREAST SURGERY     implants- early 2000  . COLONOSCOPY N/A 04/09/2019   Procedure: COLONOSCOPY;  Surgeon: Danie Binder, MD;  [facility];  Service: Endoscopy;  Laterality: N/A;  9:30am  . HEMORRHOID BANDING N/A 04/09/2019   Procedure: Thayer Jew;  Surgeon: Danie Binder, MD;  [facility];  Service: Endoscopy;  Laterality: N/A;   Social History   Social History Narrative   Married for 21 years Lives in [place]. Moved from [place]. 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    Product/process development scientist History  Administered Date(s) Administered  . PFIZER SARS-COV-2 Vaccination 11/14/2019, 12/08/2019  . Pneumococcal Conjugate-13 06/22/2019  . Pneumococcal Polysaccharide-23 04/30/2018     Objective: Vital Signs: BP 112/71 (BP Location: Right Arm, Patient Position: Sitting, Cuff Size: Normal)   Pulse 80   Resp 14   Ht 5\' 6"  (1.676 m)   Wt 163 lb (73.9 kg)   BMI 26.31 kg/m    Physical Exam Vitals and nursing note reviewed.  Constitutional:      Appearance: She is well-developed.  HENT:     Head: Normocephalic and atraumatic.  Eyes:     Conjunctiva/sclera: Conjunctivae normal.  Cardiovascular:     Rate and Rhythm: Normal rate and regular rhythm.     Heart sounds: Normal heart sounds.  Pulmonary:     Effort: Pulmonary effort is normal.     Breath sounds: Normal breath sounds.  Abdominal:     General: Bowel sounds are normal.     Palpations: Abdomen is soft.  Musculoskeletal:     Cervical back: Normal range of motion.  Lymphadenopathy:     Cervical: No cervical adenopathy.  Skin:    General: Skin is warm and dry.     Capillary Refill: Capillary refill takes less than 2 seconds.  Neurological:     Mental Status: She is alert and oriented to person, place, and time.  Psychiatric:        Behavior: Behavior normal.      Musculoskeletal Exam: C-spine, thoracic and lumbar spine were in good range of motion.  Shoulder joints, elbow joints, wrist joints, MCPs with good range of motion with no synovitis.  She has bilateral DIP thickening with no synovitis.  Hip joints, knee joints, ankles with good range of motion.  She had no tenderness over MTPs.  CDAI Exam: CDAI Score: -- Patient Global: --; Provider Global: -- Swollen: --; Tender:  -- Joint Exam 06/22/2020   No joint exam has been documented for this visit   There is currently no information documented on the homunculus. Go to the Rheumatology activity and complete the homunculus joint exam.  Investigation: No additional findings.  Imaging: No results found.  Recent Labs: Lab Results  Component Value Date   WBC 6.9 07/17/2017   HGB 13.7 07/17/2017   PLT 265 07/17/2017   NA 137 10/21/2019   K 4.7 10/21/2019   CL 102 10/21/2019   CO2 26 10/21/2019   GLUCOSE 94 10/21/2019   BUN 13 10/21/2019   CREATININE 0.89 10/21/2019   BILITOT 0.5 10/21/2019   AST 13 10/21/2019   ALT 11 10/21/2019   PROT 6.4 10/21/2019   CALCIUM 10.4 10/21/2019   GFRAA 77 10/21/2019    Speciality Comments: No specialty comments  available.  Procedures:  No procedures performed Allergies: Patient has no known allergies.   Assessment / Plan:     Visit Diagnoses: Pain in both hands -she complains of pain and stiffness in her bilateral hands.  On clinical examination the findings were consistent with osteoarthritis with DIP thickening.  No synovitis was noted.  No MCP or PIP changes were noted.  02/29/20: ANA 1:80NH, 1:160 cytoplasmic, RF<14, CCP ab <16 - Plan: XR Hand 2 View Right, XR Hand 2 View Left.  X-ray findings were reviewed with the patient which were consistent with osteoarthritis.  Joint protection and muscle strengthening was discussed.  Exercises were explained in the office.  Have given her a handout on hand exercises.  Natural anti-inflammatories were discussed.  Positive ANA-she has low titer ANA.  She has no clinical features of autoimmune disease.  At this point I do not think any further work-up is necessary.  I made her aware of the  clinical features of autoimmune disease.  She supposed to notify me if she develops new symptoms.  Right knee pain-she is off-and-on discomfort in her right knee.  She states she had a fall about 6 years ago.  She had complete work-up at  the time which was negative.  She had no warmth swelling or effusion in her knee joint.  She had good range of motion.  No further work-up was done today.  Vitamin D deficiency-she states that she has history of vitamin D deficiency and she takes vitamin D supplement.  History of osteopenia-she states she had a DEXA scan while she was in [place] and it was consistent with osteopenia.  History of hyperlipidemia  History of postmenopausal HRT  Osteoporosis screening-I will schedule DEXA scan to evaluate for BMD.  Orders: Orders Placed This Encounter  Procedures  . XR Hand 2 View Right  . XR Hand 2 View Left   No orders of the defined types were placed in this encounter.   Face-to-face time spent patient was over 45 minutes.  Follow-Up Instructions: Return in about 6 months (around 12/20/2020) for Osteoarthritis.   Bo Merino, MD  Note - This record has been created using Editor, commissioning.  Chart creation errors have been sought, but may not always  have been located. Such creation errors do not reflect on  the standard of medical care.

## 2020-06-22 NOTE — Patient Instructions (Signed)
Hand Exercises Hand exercises can be helpful for almost anyone. These exercises can strengthen the hands, improve flexibility and movement, and increase blood flow to the hands. These results can make work and daily tasks easier. Hand exercises can be especially helpful for people who have joint pain from arthritis or have nerve damage from overuse (carpal tunnel syndrome). These exercises can also help people who have injured a hand. Exercises Most of these hand exercises are gentle stretching and motion exercises. It is usually safe to do them often throughout the day. Warming up your hands before exercise may help to reduce stiffness. You can do this with gentle massage or by placing your hands in warm water for 10-15 minutes. It is normal to feel some stretching, pulling, tightness, or mild discomfort as you begin new exercises. This will gradually improve. Stop an exercise right away if you feel sudden, severe pain or your pain gets worse. Ask your health care provider which exercises are best for you. Knuckle bend or "claw" fist 1. Stand or sit with your arm, hand, and all five fingers pointed straight up. Make sure to keep your wrist straight during the exercise. 2. Gently bend your fingers down toward your palm until the tips of your fingers are touching the top of your palm. Keep your big knuckle straight and just bend the small knuckles in your fingers. 3. Hold this position for __________ seconds. 4. Straighten (extend) your fingers back to the starting position. Repeat this exercise 5-10 times with each hand. Full finger fist 1. Stand or sit with your arm, hand, and all five fingers pointed straight up. Make sure to keep your wrist straight during the exercise. 2. Gently bend your fingers into your palm until the tips of your fingers are touching the middle of your palm. 3. Hold this position for __________ seconds. 4. Extend your fingers back to the starting position, stretching every  joint fully. Repeat this exercise 5-10 times with each hand. Straight fist 1. Stand or sit with your arm, hand, and all five fingers pointed straight up. Make sure to keep your wrist straight during the exercise. 2. Gently bend your fingers at the big knuckle, where your fingers meet your hand, and the middle knuckle. Keep the knuckle at the tips of your fingers straight and try to touch the bottom of your palm. 3. Hold this position for __________ seconds. 4. Extend your fingers back to the starting position, stretching every joint fully. Repeat this exercise 5-10 times with each hand. Tabletop 1. Stand or sit with your arm, hand, and all five fingers pointed straight up. Make sure to keep your wrist straight during the exercise. 2. Gently bend your fingers at the big knuckle, where your fingers meet your hand, as far down as you can while keeping the small knuckles in your fingers straight. Think of forming a tabletop with your fingers. 3. Hold this position for __________ seconds. 4. Extend your fingers back to the starting position, stretching every joint fully. Repeat this exercise 5-10 times with each hand. Finger spread 1. Place your hand flat on a table with your palm facing down. Make sure your wrist stays straight as you do this exercise. 2. Spread your fingers and thumb apart from each other as far as you can until you feel a gentle stretch. Hold this position for __________ seconds. 3. Bring your fingers and thumb tight together again. Hold this position for __________ seconds. Repeat this exercise 5-10 times with each hand.   Making circles 1. Stand or sit with your arm, hand, and all five fingers pointed straight up. Make sure to keep your wrist straight during the exercise. 2. Make a circle by touching the tip of your thumb to the tip of your index finger. 3. Hold for __________ seconds. Then open your hand wide. 4. Repeat this motion with your thumb and each finger on your  hand. Repeat this exercise 5-10 times with each hand. Thumb motion 1. Sit with your forearm resting on a table and your wrist straight. Your thumb should be facing up toward the ceiling. Keep your fingers relaxed as you move your thumb. 2. Lift your thumb up as high as you can toward the ceiling. Hold for __________ seconds. 3. Bend your thumb across your palm as far as you can, reaching the tip of your thumb for the small finger (pinkie) side of your palm. Hold for __________ seconds. Repeat this exercise 5-10 times with each hand. Grip strengthening  1. Hold a stress ball or other soft ball in the middle of your hand. 2. Slowly increase the pressure, squeezing the ball as much as you can without causing pain. Think of bringing the tips of your fingers into the middle of your palm. All of your finger joints should bend when doing this exercise. 3. Hold your squeeze for __________ seconds, then relax. Repeat this exercise 5-10 times with each hand. Contact a health care provider if:  Your hand pain or discomfort gets much worse when you do an exercise.  Your hand pain or discomfort does not improve within 2 hours after you exercise. If you have any of these problems, stop doing these exercises right away. Do not do them again unless your health care provider says that you can. Get help right away if:  You develop sudden, severe hand pain or swelling. If this happens, stop doing these exercises right away. Do not do them again unless your health care provider says that you can. This information is not intended to replace advice given to you by your health care provider. Make sure you discuss any questions you have with your health care provider. Document Revised: 12/31/2018 Document Reviewed: 09/10/2018 Elsevier Patient Education  2020 [employer]  

## 2020-07-20 ENCOUNTER — Ambulatory Visit: Payer: Medicare Other | Admitting: Rheumatology

## 2020-07-20 ENCOUNTER — Other Ambulatory Visit (HOSPITAL_COMMUNITY): Payer: Medicare Other

## 2020-07-31 ENCOUNTER — Other Ambulatory Visit (HOSPITAL_COMMUNITY): Payer: Medicare Other

## 2020-08-07 ENCOUNTER — Ambulatory Visit (INDEPENDENT_AMBULATORY_CARE_PROVIDER_SITE_OTHER): Payer: Medicare Other | Admitting: Internal Medicine

## 2020-08-08 ENCOUNTER — Ambulatory Visit (INDEPENDENT_AMBULATORY_CARE_PROVIDER_SITE_OTHER): Payer: Medicare Other | Admitting: Internal Medicine

## 2020-08-08 ENCOUNTER — Other Ambulatory Visit: Payer: Self-pay

## 2020-08-08 ENCOUNTER — Encounter (INDEPENDENT_AMBULATORY_CARE_PROVIDER_SITE_OTHER): Payer: Self-pay | Admitting: Internal Medicine

## 2020-08-08 VITALS — BP 124/74 | HR 90 | Temp 97.5°F | Ht 65.75 in | Wt 161.4 lb

## 2020-08-08 DIAGNOSIS — E782 Mixed hyperlipidemia: Secondary | ICD-10-CM

## 2020-08-08 DIAGNOSIS — E559 Vitamin D deficiency, unspecified: Secondary | ICD-10-CM

## 2020-08-08 DIAGNOSIS — E2839 Other primary ovarian failure: Secondary | ICD-10-CM | POA: Diagnosis not present

## 2020-08-08 DIAGNOSIS — Z23 Encounter for immunization: Secondary | ICD-10-CM

## 2020-08-08 NOTE — Addendum Note (Signed)
Addended by: Anibal Henderson on: 08/08/2020 04:40 PM   Modules accepted: Orders

## 2020-08-08 NOTE — Progress Notes (Signed)
Metrics: Intervention Frequency ACO  Documented Smoking Status Yearly  Screened one or more times in 24 months  Cessation Counseling or  Active cessation medication Past 24 months  Past 24 months   Guideline developer: UpToDate (See UpToDate for funding source) Date Released: 2014       Wellness Office Visit  Subjective:  Patient ID: Rachael Padilla, female    DOB: May 15, 1951  Age: 69 y.o. MRN: [id number]  CC: This pleasant lady comes in for follow-up regarding her bioidentical hormone therapy, hyperlipidemia and vitamin D deficiency. HPI She was scheduled for an annual physical exam but Medicare does not pay for this and we will reschedule another visit another time. She continues on the same doses of bioidentical hormones including progesterone, estradiol patch and testosterone cream.  She also continues on NP thyroid. She is not having any major issues with these hormones and unfortunately her husband has myelodysplastic syndrome and has become sicker recently and this is caused her some stress.  She was swimming on a regular basis but has not been swimming for the last 1 month.  Past Medical History:  Diagnosis Date  . Decreased libido   . HLD (hyperlipidemia)   . Vitamin D deficiency disease    Past Surgical History:  Procedure Laterality Date  . APPENDECTOMY     early 25s  . AUGMENTATION MAMMAPLASTY    . BREAST SURGERY     implants- early 2000  . COLONOSCOPY N/A 04/09/2019   Procedure: COLONOSCOPY;  Surgeon: Danie Binder, MD;  [facility];  Service: Endoscopy;  Laterality: N/A;  9:30am  . HEMORRHOID BANDING N/A 04/09/2019   Procedure: Thayer Jew;  Surgeon: Danie Binder, MD;  [facility];  Service: Endoscopy;  Laterality: N/A;     Family History  Problem Relation Age of Onset  . Mental illness Mother   . Dementia Mother   . Early death Father   . Emphysema Father   . Healthy Sister   . Arthritis Brother   . Healthy  Daughter   . Healthy Daughter   . Early death Brother   . Cirrhosis Brother   . Alcohol abuse Brother   . Breast cancer Paternal Aunt   . Colon cancer Neg Hx     Social History   Social History Narrative   Married for 21 years Lives in [place]. Moved from [place]. 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    Social History   Tobacco Use  . Smoking status: Former Smoker    Quit date: 1979    Years since quitting: 42.9  . Smokeless tobacco: Never Used  Substance Use Topics  . Alcohol use: Yes    Comment: rarely    Current Meds  Medication Sig  . Cholecalciferol (DIALYVITE VITAMIN D 5000) 125 MCG (5000 UT) capsule Take 15,000 Units by mouth daily.  Marland Kitchen estradiol (VIVELLE-DOT) 0.1 MG/24HR patch Place 1 patch (0.1 mg total) onto the skin 2 (two) times a week.  . NP THYROID 120 MG tablet TAKE 1 TABLET (120 MG TOTAL) BY MOUTH DAILY BEFORE BREAKFAST.  . progesterone (PROMETRIUM) 200 MG capsule Take 1 capsule (200 mg total) by mouth every evening.  . Testosterone Propionate (FIRST-TESTOSTERONE MC) 2 % CREA Place 5 mg onto the skin daily.  . vitamin C (ASCORBIC ACID) 500 MG tablet Take 1,500 mg by mouth daily.      Depression screen [facility] 2/9 08/08/2020 02/29/2020 04/30/2018 09/26/2017 08/20/2017  Decreased Interest 0 0 0 0 0  Down,  Depressed, Hopeless 1 0 0 0 0  PHQ - 2 Score 1 0 0 0 0  Altered sleeping 0 - - - -  Tired, decreased energy 1 - - - -  Change in appetite 2 - - - -  Feeling bad or failure about yourself  1 - - - -  Trouble concentrating 1 - - - -  Moving slowly or fidgety/restless 0 - - - -  Suicidal thoughts 0 - - - -  PHQ-9 Score 6 - - - -  Difficult doing work/chores Not difficult at all - - - -     Objective:   Today's Vitals: BP 124/74   Pulse 90   Temp (!) 97.5 F (36.4 C) (Temporal)   Ht 5' 5.75" (1.67 m)   Wt 161 lb 6.4 oz (73.2 kg)   SpO2 96%   BMI 26.25 kg/m  Vitals with BMI 08/08/2020 06/22/2020 06/22/2020  Height 5' 5.75" - 5\' 6"   Weight 161 lbs 6 oz -  163 lbs  BMI 26.94 - 85.46  Systolic 270 350 093  Diastolic 74 71 76  Pulse 90 80 85     Physical Exam    She looks systemically well.  Weight is stable.  Blood pressure is excellent.   Assessment   1. Primary ovarian failure   2. Mixed hyperlipidemia   3. Vitamin D deficiency disease       Tests ordered No orders of the defined types were placed in this encounter.    Plan: 1. She will continue with estradiol, progesterone and testosterone therapy. 2. She will continue with desiccated NP thyroid. 3. She will continue with vitamin D3 supplementation for vitamin D deficiency. 4. High-dose influenza vaccination was given today. 5. I will see her in January when we will have more time for her annual physical exam   No orders of the defined types were placed in this encounter.   Doree Albee, MD

## 2020-08-22 ENCOUNTER — Telehealth: Payer: Self-pay

## 2020-08-22 ENCOUNTER — Other Ambulatory Visit (HOSPITAL_COMMUNITY): Payer: Medicare Other

## 2020-08-22 NOTE — Telephone Encounter (Signed)
FYI:  Patient left a voicemail stating "someone is scheduling appointments for me when I have left multiple messages stating that I cannot schedule any appointments for myself at this time due to caring for my husband."  Patient states she will call back to schedule an appointment when she is available.

## 2020-08-23 NOTE — Telephone Encounter (Signed)
Noted. Reviewing patient's chart I do not see where we have made any calls to reschedule an appointment.

## 2020-08-24 ENCOUNTER — Other Ambulatory Visit (HOSPITAL_COMMUNITY): Payer: Medicare Other

## 2020-09-05 ENCOUNTER — Encounter (INDEPENDENT_AMBULATORY_CARE_PROVIDER_SITE_OTHER): Payer: Self-pay | Admitting: Internal Medicine

## 2020-09-05 ENCOUNTER — Other Ambulatory Visit (INDEPENDENT_AMBULATORY_CARE_PROVIDER_SITE_OTHER): Payer: Self-pay | Admitting: Internal Medicine

## 2020-09-05 MED ORDER — NP THYROID 30 MG PO TABS: 30.0000 mg | ORAL_TABLET | Freq: Every day | ORAL | 0 refills | Status: DC

## 2020-09-19 ENCOUNTER — Other Ambulatory Visit (INDEPENDENT_AMBULATORY_CARE_PROVIDER_SITE_OTHER): Payer: Self-pay | Admitting: Nurse Practitioner

## 2020-09-19 DIAGNOSIS — Z79890 Hormone replacement therapy (postmenopausal): Secondary | ICD-10-CM

## 2020-09-25 ENCOUNTER — Encounter (INDEPENDENT_AMBULATORY_CARE_PROVIDER_SITE_OTHER): Payer: Self-pay | Admitting: Internal Medicine

## 2020-09-27 ENCOUNTER — Ambulatory Visit: Payer: Medicare Other | Admitting: Rheumatology

## 2020-10-10 ENCOUNTER — Other Ambulatory Visit (INDEPENDENT_AMBULATORY_CARE_PROVIDER_SITE_OTHER): Payer: Self-pay | Admitting: Internal Medicine

## 2020-10-12 ENCOUNTER — Ambulatory Visit (INDEPENDENT_AMBULATORY_CARE_PROVIDER_SITE_OTHER): Payer: Medicare Other | Admitting: Internal Medicine

## 2020-11-15 ENCOUNTER — Encounter (INDEPENDENT_AMBULATORY_CARE_PROVIDER_SITE_OTHER): Payer: Self-pay | Admitting: Internal Medicine

## 2020-11-17 ENCOUNTER — Other Ambulatory Visit (INDEPENDENT_AMBULATORY_CARE_PROVIDER_SITE_OTHER): Payer: Self-pay | Admitting: Nurse Practitioner

## 2020-11-30 ENCOUNTER — Other Ambulatory Visit (INDEPENDENT_AMBULATORY_CARE_PROVIDER_SITE_OTHER): Payer: Self-pay | Admitting: Internal Medicine

## 2020-12-13 ENCOUNTER — Other Ambulatory Visit: Payer: Self-pay

## 2020-12-13 ENCOUNTER — Encounter (INDEPENDENT_AMBULATORY_CARE_PROVIDER_SITE_OTHER): Payer: Self-pay | Admitting: Internal Medicine

## 2020-12-13 ENCOUNTER — Ambulatory Visit (INDEPENDENT_AMBULATORY_CARE_PROVIDER_SITE_OTHER): Payer: Medicare Other | Admitting: Internal Medicine

## 2020-12-13 VITALS — BP 120/67 | HR 80 | Temp 97.3°F | Resp 18 | Ht 65.0 in | Wt 166.0 lb

## 2020-12-13 DIAGNOSIS — R5383 Other fatigue: Secondary | ICD-10-CM | POA: Diagnosis not present

## 2020-12-13 DIAGNOSIS — G8929 Other chronic pain: Secondary | ICD-10-CM | POA: Diagnosis not present

## 2020-12-13 DIAGNOSIS — E2839 Other primary ovarian failure: Secondary | ICD-10-CM | POA: Diagnosis not present

## 2020-12-13 DIAGNOSIS — E782 Mixed hyperlipidemia: Secondary | ICD-10-CM | POA: Diagnosis not present

## 2020-12-13 DIAGNOSIS — M545 Low back pain, unspecified: Secondary | ICD-10-CM | POA: Diagnosis not present

## 2020-12-13 DIAGNOSIS — R5381 Other malaise: Secondary | ICD-10-CM

## 2020-12-13 DIAGNOSIS — E559 Vitamin D deficiency, unspecified: Secondary | ICD-10-CM

## 2020-12-13 NOTE — Progress Notes (Signed)
Metrics: Intervention Frequency ACO  Documented Smoking Status Yearly  Screened one or more times in 24 months  Cessation Counseling or  Active cessation medication Past 24 months  Past 24 months   Guideline developer: UpToDate (See UpToDate for funding source) Date Released: 2014       Wellness Office Visit  Subjective:  Patient ID: Rachael Padilla, female    DOB: 05-30-1951  Age: 70 y.o. MRN: [id number]  CC: This lady comes in for follow-up of bioidentical hormone therapy, hyperlipidemia, vitamin D deficiency. HPI  Today she complains of low back pain which she has had for years.  In May 2021, I did order MRI lumbar spine but for some reason it was not done. She continues on estradiol patch, testosterone cream, progesterone by mouth, desiccated NP thyroid.  She is tolerating all these and doses have been adjusted mostly downwards more recently. She does have hyperlipidemia and is trying to work on reducing visceral fat by nutrition and exercise. Unfortunately, her husband has myelodysplastic syndrome and he has been quite sick for the last 6 months but seems to be improving now.  However as a result of her husband's sickness, she has not been swimming on a regular basis like she used to. Past Medical History:  Diagnosis Date  . Decreased libido   . HLD (hyperlipidemia)   . Vitamin D deficiency disease    Past Surgical History:  Procedure Laterality Date  . APPENDECTOMY     early 41s  . AUGMENTATION MAMMAPLASTY    . BREAST SURGERY     implants- early 2000  . COLONOSCOPY N/A 04/09/2019   Procedure: COLONOSCOPY;  Surgeon: Danie Binder, MD;  [facility];  Service: Endoscopy;  Laterality: N/A;  9:30am  . HEMORRHOID BANDING N/A 04/09/2019   Procedure: Thayer Jew;  Surgeon: Danie Binder, MD;  [facility];  Service: Endoscopy;  Laterality: N/A;     Family History  Problem Relation Age of Onset  . Mental illness Mother   . Dementia  Mother   . Early death Father   . Emphysema Father   . Healthy Sister   . Arthritis Brother   . Healthy Daughter   . Healthy Daughter   . Early death Brother   . Cirrhosis Brother   . Alcohol abuse Brother   . Breast cancer Paternal Aunt   . Colon cancer Neg Hx     Social History   Social History Narrative   Married for 21 years Lives in [place]. Moved from [place]. 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    Social History   Tobacco Use  . Smoking status: Former Smoker    Quit date: 1979    Years since quitting: 43.2  . Smokeless tobacco: Never Used  Substance Use Topics  . Alcohol use: Yes    Comment: rarely    Current Meds  Medication Sig  . Cholecalciferol (DIALYVITE VITAMIN D 5000) 125 MCG (5000 UT) capsule Take 15,000 Units by mouth daily.  Marland Kitchen estradiol (VIVELLE-DOT) 0.1 MG/24HR patch PLACE 1 PATCH ONTO THE SKIN TWICE WEEKLY  . NP THYROID 120 MG tablet TAKE ONE TABLET BY MOUTH DAILY BEFORE BREAKFAST  . NP THYROID 30 MG tablet TAKE 1 TABLET(30 MG) BY MOUTH DAILY BEFORE AND BREAKFAST  . progesterone (PROMETRIUM) 200 MG capsule TAKE ONE CAPSULE BY MOUTH EVERY EVENING  . Testosterone Propionate (FIRST-TESTOSTERONE MC) 2 % CREA Place 5 mg onto the skin daily.  . vitamin C (ASCORBIC ACID) 500 MG tablet  Take 1,500 mg by mouth daily.     [place] Office Visit from 08/08/2020 in [facility]  PHQ-9 Total Score 6      Objective:   Today's Vitals: BP 120/67 (BP Location: Left Arm, Patient Position: Sitting, Cuff Size: Normal)   Pulse 80   Temp (!) 97.3 F (36.3 C) (Temporal)   Resp 18   Ht 5\' 5"  (1.651 m)   Wt 166 lb (75.3 kg)   SpO2 99%   BMI 27.62 kg/m  Vitals with BMI 12/13/2020 08/08/2020 06/22/2020  Height 5\' 5"  5' 5.75" -  Weight 166 lbs 161 lbs 6 oz -  BMI 47.09 62.83 -  Systolic 662 947 654  Diastolic 67 74 71  Pulse 80 90 80     Physical Exam   She looks systemically well and extremely good for her age.  Blood pressure is  excellent.    Assessment   1. Primary ovarian failure   2. Mixed hyperlipidemia   3. Vitamin D deficiency disease   4. Malaise and fatigue   5. Chronic bilateral low back pain without sciatica       Tests ordered Orders Placed This Encounter  Procedures  . MR Lumbar Spine Wo Contrast  . COMPLETE METABOLIC PANEL WITH GFR  . Lipid panel  . VITAMIN D 25 Hydroxy (Vit-D Deficiency, Fractures)  . DHEA-sulfate  . Estradiol  . Progesterone  . T3, free  . T4, free  . TSH  . Testos,Total,Free and SHBG (Female)  . CBC     Plan: 1. She will continue with all bioidentical hormones and we will check levels today. 2. She will continue with vitamin D3 15,000 units daily and we will check levels today. 3. I will order MRI lumbar spine for further evaluation of her chronic low back pain. 4. Follow-up in about 3 to 4 months and further recommendations will depend on blood results.   No orders of the defined types were placed in this encounter.   Doree Albee, MD

## 2020-12-14 ENCOUNTER — Encounter (INDEPENDENT_AMBULATORY_CARE_PROVIDER_SITE_OTHER): Payer: Self-pay | Admitting: Internal Medicine

## 2020-12-18 LAB — COMPLETE METABOLIC PANEL WITH GFR
AG Ratio: 1.8 (calc) (ref 1.0–2.5)
ALT: 8 U/L (ref 6–29)
AST: 11 U/L (ref 10–35)
Albumin: 3.9 g/dL (ref 3.6–5.1)
Alkaline phosphatase (APISO): 45 U/L (ref 37–153)
BUN: 17 mg/dL (ref 7–25)
CO2: 23 mmol/L (ref 20–32)
Calcium: 9.2 mg/dL (ref 8.6–10.4)
Chloride: 107 mmol/L (ref 98–110)
Creat: 0.78 mg/dL (ref 0.60–0.93)
GFR, Est African American: 89 mL/min/{1.73_m2} (ref >=60)
GFR, Est Non African American: 77 mL/min/{1.73_m2} (ref >=60)
Globulin: 2.2 g/dL (calc) (ref 1.9–3.7)
Glucose, Bld: 91 mg/dL (ref 65–99)
Potassium: 4.2 mmol/L (ref 3.5–5.3)
Sodium: 137 mmol/L (ref 135–146)
Total Bilirubin: 0.3 mg/dL (ref 0.2–1.2)
Total Protein: 6.1 g/dL (ref 6.1–8.1)

## 2020-12-18 LAB — LIPID PANEL
Cholesterol: 211 mg/dL — ABNORMAL HIGH (ref <200)
HDL: 46 mg/dL — ABNORMAL LOW (ref >=50)
LDL Cholesterol (Calc): 135 mg/dL (calc) — ABNORMAL HIGH
Non-HDL Cholesterol (Calc): 165 mg/dL (calc) — ABNORMAL HIGH (ref <130)
Total CHOL/HDL Ratio: 4.6 (calc) (ref <5.0)
Triglycerides: 160 mg/dL — ABNORMAL HIGH (ref <150)

## 2020-12-18 LAB — TSH: TSH: <0.01 mIU/L — ABNORMAL LOW (ref 0.40–4.50)

## 2020-12-18 LAB — TESTOS,TOTAL,FREE AND SHBG (FEMALE)
Free Testosterone: 17.3 pg/mL — ABNORMAL HIGH (ref 0.2–3.7)
Sex Hormone Binding: 115 nmol/L — ABNORMAL HIGH (ref 14–73)
Testosterone, Total, LC-MS-MS: 201 ng/dL — ABNORMAL HIGH (ref 2–45)

## 2020-12-18 LAB — ESTRADIOL: Estradiol: 29 pg/mL

## 2020-12-18 LAB — CBC
HCT: 37.1 % (ref 35.0–45.0)
Hemoglobin: 12.1 g/dL (ref 11.7–15.5)
MCH: 28.8 pg (ref 27.0–33.0)
MCHC: 32.6 g/dL (ref 32.0–36.0)
MCV: 88.3 fL (ref 80.0–100.0)
MPV: 11.2 fL (ref 7.5–12.5)
Platelets: 280 10*3/uL (ref 140–400)
RBC: 4.2 10*6/uL (ref 3.80–5.10)
RDW: 11.8 % (ref 11.0–15.0)
WBC: 6.5 10*3/uL (ref 3.8–10.8)

## 2020-12-18 LAB — DHEA-SULFATE: DHEA-SO4: 165 ug/dL — ABNORMAL HIGH (ref 9–118)

## 2020-12-18 LAB — PROGESTERONE: Progesterone: 22.8 ng/mL

## 2020-12-18 LAB — T4, FREE: Free T4: 1.3 ng/dL (ref 0.8–1.8)

## 2020-12-18 LAB — VITAMIN D 25 HYDROXY (VIT D DEFICIENCY, FRACTURES): Vit D, 25-Hydroxy: 127 ng/mL — ABNORMAL HIGH (ref 30–100)

## 2020-12-18 LAB — T3, FREE: T3, Free: 5 pg/mL — ABNORMAL HIGH (ref 2.3–4.2)

## 2020-12-19 ENCOUNTER — Other Ambulatory Visit (INDEPENDENT_AMBULATORY_CARE_PROVIDER_SITE_OTHER): Payer: Self-pay | Admitting: Internal Medicine

## 2020-12-19 MED ORDER — ESTRADIOL 1 MG PO TABS: 1.0000 mg | ORAL_TABLET | Freq: Every day | ORAL | 3 refills | Status: DC

## 2020-12-27 ENCOUNTER — Encounter (INDEPENDENT_AMBULATORY_CARE_PROVIDER_SITE_OTHER): Payer: Self-pay | Admitting: Internal Medicine

## 2020-12-28 ENCOUNTER — Ambulatory Visit (HOSPITAL_COMMUNITY): Admission: RE | Admit: 2020-12-28 | Payer: Medicare Other | Source: Ambulatory Visit

## 2020-12-28 ENCOUNTER — Other Ambulatory Visit (INDEPENDENT_AMBULATORY_CARE_PROVIDER_SITE_OTHER): Payer: Self-pay | Admitting: Internal Medicine

## 2020-12-28 ENCOUNTER — Encounter (HOSPITAL_COMMUNITY): Payer: Self-pay

## 2020-12-28 ENCOUNTER — Telehealth (INDEPENDENT_AMBULATORY_CARE_PROVIDER_SITE_OTHER): Payer: Self-pay

## 2020-12-28 ENCOUNTER — Other Ambulatory Visit: Payer: Self-pay

## 2020-12-28 DIAGNOSIS — Z79890 Hormone replacement therapy (postmenopausal): Secondary | ICD-10-CM

## 2020-12-28 MED ORDER — LORAZEPAM 0.5 MG PO TABS: 0.5000 mg | ORAL_TABLET | Freq: Two times a day (BID) | ORAL | 0 refills | Status: AC | PRN

## 2020-12-28 MED ORDER — PROGESTERONE 200 MG PO CAPS
200.0000 mg | ORAL_CAPSULE | Freq: Every evening | ORAL | 1 refills | Status: AC
Start: 2020-12-28 — End: ?

## 2020-12-28 NOTE — Telephone Encounter (Signed)
Correction:  Patient stated that you prescribed her the Lorazepam and she did not use it and not sure if this is too old to use or if this is the best to use?  Please advise.

## 2020-12-28 NOTE — Telephone Encounter (Signed)
Okay, let her know that I have sent a prescription of lorazepam to the [facility] in [place].

## 2020-12-28 NOTE — Telephone Encounter (Signed)
Patient came into the office and stated that she was not able to complete the MRI today. Patient stated that she needs something to help her get through the MRI. Can you send something to [facility] in [place]? Patient stated that she took some Valium for the MRI she had in [place]. She stated that she is going through a lot of stress with her husband and his health care.

## 2020-12-28 NOTE — Telephone Encounter (Signed)
Called patient and gave her the message. Patient verbalized an understanding and thanked [place].

## 2021-01-02 DIAGNOSIS — D225 Melanocytic nevi of trunk: Secondary | ICD-10-CM | POA: Diagnosis not present

## 2021-01-02 DIAGNOSIS — D2272 Melanocytic nevi of left lower limb, including hip: Secondary | ICD-10-CM | POA: Diagnosis not present

## 2021-01-02 DIAGNOSIS — D485 Neoplasm of uncertain behavior of skin: Secondary | ICD-10-CM | POA: Diagnosis not present

## 2021-01-02 DIAGNOSIS — L570 Actinic keratosis: Secondary | ICD-10-CM | POA: Diagnosis not present

## 2021-01-02 DIAGNOSIS — Z1283 Encounter for screening for malignant neoplasm of skin: Secondary | ICD-10-CM | POA: Diagnosis not present

## 2021-01-02 DIAGNOSIS — X32XXXD Exposure to sunlight, subsequent encounter: Secondary | ICD-10-CM | POA: Diagnosis not present

## 2021-01-08 ENCOUNTER — Encounter (INDEPENDENT_AMBULATORY_CARE_PROVIDER_SITE_OTHER): Payer: Self-pay | Admitting: Internal Medicine

## 2021-01-09 ENCOUNTER — Encounter (INDEPENDENT_AMBULATORY_CARE_PROVIDER_SITE_OTHER): Payer: Self-pay | Admitting: Internal Medicine

## 2021-01-09 ENCOUNTER — Other Ambulatory Visit (INDEPENDENT_AMBULATORY_CARE_PROVIDER_SITE_OTHER): Payer: Self-pay | Admitting: Internal Medicine

## 2021-01-09 DIAGNOSIS — I781 Nevus, non-neoplastic: Secondary | ICD-10-CM | POA: Diagnosis not present

## 2021-01-09 MED ORDER — ESTRADIOL 2 MG PO TABS: 2.0000 mg | ORAL_TABLET | Freq: Every day | ORAL | 3 refills | Status: DC

## 2021-01-09 MED ORDER — NP THYROID 120 MG PO TABS: ORAL_TABLET | ORAL | 1 refills | Status: AC

## 2021-01-11 ENCOUNTER — Encounter (INDEPENDENT_AMBULATORY_CARE_PROVIDER_SITE_OTHER): Payer: Self-pay | Admitting: Internal Medicine

## 2021-01-12 ENCOUNTER — Other Ambulatory Visit: Payer: Self-pay

## 2021-01-12 ENCOUNTER — Ambulatory Visit (HOSPITAL_COMMUNITY)
Admission: RE | Admit: 2021-01-12 | Discharge: 2021-01-12 | Disposition: A | Discharge status: Home / Self Care | Payer: Medicare Other | Source: Ambulatory Visit | Attending: Internal Medicine | Admitting: Internal Medicine

## 2021-01-12 DIAGNOSIS — G8929 Other chronic pain: Secondary | ICD-10-CM | POA: Insufficient documentation

## 2021-01-12 DIAGNOSIS — M545 Low back pain, unspecified: Secondary | ICD-10-CM | POA: Diagnosis not present

## 2021-01-15 ENCOUNTER — Other Ambulatory Visit (INDEPENDENT_AMBULATORY_CARE_PROVIDER_SITE_OTHER): Payer: Self-pay | Admitting: Internal Medicine

## 2021-01-15 DIAGNOSIS — G8929 Other chronic pain: Secondary | ICD-10-CM

## 2021-02-08 ENCOUNTER — Encounter (INDEPENDENT_AMBULATORY_CARE_PROVIDER_SITE_OTHER): Payer: Self-pay | Admitting: Internal Medicine

## 2021-02-09 MED ORDER — PAXLOVID 20 X 150 MG & 10 X 100MG PO TBPK: 1.0000 | ORAL_TABLET | Freq: Two times a day (BID) | ORAL | 0 refills | Status: AC

## 2021-02-12 ENCOUNTER — Encounter (INDEPENDENT_AMBULATORY_CARE_PROVIDER_SITE_OTHER): Payer: Self-pay | Admitting: Internal Medicine

## 2021-02-28 ENCOUNTER — Other Ambulatory Visit: Payer: Self-pay

## 2021-02-28 ENCOUNTER — Encounter (INDEPENDENT_AMBULATORY_CARE_PROVIDER_SITE_OTHER): Payer: Self-pay | Admitting: Internal Medicine

## 2021-02-28 ENCOUNTER — Ambulatory Visit (INDEPENDENT_AMBULATORY_CARE_PROVIDER_SITE_OTHER): Payer: Medicare Other | Admitting: Internal Medicine

## 2021-02-28 VITALS — BP 122/82 | HR 85 | Temp 97.8°F | Ht 65.0 in | Wt 162.4 lb

## 2021-02-28 DIAGNOSIS — F4321 Adjustment disorder with depressed mood: Secondary | ICD-10-CM | POA: Diagnosis not present

## 2021-02-28 DIAGNOSIS — F5104 Psychophysiologic insomnia: Secondary | ICD-10-CM | POA: Diagnosis not present

## 2021-02-28 MED ORDER — ZOLPIDEM TARTRATE 5 MG PO TABS: 5.0000 mg | ORAL_TABLET | Freq: Every evening | ORAL | 1 refills | Status: DC | PRN

## 2021-02-28 NOTE — Progress Notes (Signed)
Metrics: Intervention Frequency ACO  Documented Smoking Status Yearly  Screened one or more times in 24 months  Cessation Counseling or  Active cessation medication Past 24 months  Past 24 months   Guideline developer: UpToDate (See UpToDate for funding source) Date Released: 2014       Wellness Office Visit  Subjective:  Patient ID: Rachael Padilla, female    DOB: [birth year]  Age: 70 y.o. MRN: [id number]  CC: Insomnia. HPI  This patient comes in as an acute visit because her husband is now apparently dying from end-stage myelodysplasia.  He is in [facility].  All treatments apparently have been stopped.  The patient herself cannot sleep because she is extremely stressed. Past Medical History:  Diagnosis Date  . Decreased libido   . HLD (hyperlipidemia)   . Vitamin D deficiency disease    Past Surgical History:  Procedure Laterality Date  . APPENDECTOMY     early 33s  . AUGMENTATION MAMMAPLASTY    . BREAST SURGERY     implants- early 2000  . COLONOSCOPY N/A 04/09/2019   Procedure: COLONOSCOPY;  Surgeon: Danie Binder, MD;  [facility];  Service: Endoscopy;  Laterality: N/A;  9:30am  . HEMORRHOID BANDING N/A 04/09/2019   Procedure: Thayer Jew;  Surgeon: Danie Binder, MD;  [facility];  Service: Endoscopy;  Laterality: N/A;     Family History  Problem Relation Age of Onset  . Mental illness Mother   . Dementia Mother   . Early death Father   . Emphysema Father   . Healthy Sister   . Arthritis Brother   . Healthy Daughter   . Healthy Daughter   . Early death Brother   . Cirrhosis Brother   . Alcohol abuse Brother   . Breast cancer Paternal Aunt   . Colon cancer Neg Hx     Social History   Social History Narrative   Married for 21 years Lives in [place]. Moved from [place]. 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    Social History   Tobacco Use  . Smoking status: Former Smoker    Quit date: 1979    Years  since quitting: 43.4  . Smokeless tobacco: Never Used  Substance Use Topics  . Alcohol use: Yes    Comment: rarely    Current Meds  Medication Sig  . Cholecalciferol (DIALYVITE VITAMIN D 5000) 125 MCG (5000 UT) capsule Take 15,000 Units by mouth daily.  Marland Kitchen LORazepam (ATIVAN) 0.5 MG tablet Take 1 tablet (0.5 mg total) by mouth 2 (two) times daily as needed for anxiety.  . NP THYROID 120 MG tablet TAKE ONE TABLET BY MOUTH DAILY BEFORE BREAKFAST  . NP THYROID 30 MG tablet TAKE 1 TABLET(30 MG) BY MOUTH DAILY BEFORE AND BREAKFAST  . progesterone (PROMETRIUM) 200 MG capsule Take 1 capsule (200 mg total) by mouth every evening.  . Testosterone Propionate (FIRST-TESTOSTERONE MC) 2 % CREA Place 5 mg onto the skin daily.  . vitamin C (ASCORBIC ACID) 500 MG tablet Take 1,500 mg by mouth daily.  Marland Kitchen zolpidem (AMBIEN) 5 MG tablet Take 1 tablet (5 mg total) by mouth at bedtime as needed for sleep.     [place] Office Visit from 08/08/2020 in [facility]  PHQ-9 Total Score 6      Objective:   Today's Vitals: BP 122/82   Pulse 85   Temp 97.8 F (36.6 C) (Temporal)   Ht 5\' 5"  (1.651 m)   Wt  162 lb 6.4 oz (73.7 kg)   SpO2 99%   BMI 27.02 kg/m  Vitals with BMI 02/28/2021 12/13/2020 08/08/2020  Height 5\' 5"  5\' 5"  5' 5.75"  Weight 162 lbs 6 oz 166 lbs 161 lbs 6 oz  BMI 27.02 88.28 00.34  Systolic 917 915 056  Diastolic 82 67 74  Pulse 85 80 90     Physical Exam   Tearful.  She has lost weight.    Assessment   1. Grief reaction   2. Psychophysiological insomnia       Tests ordered No orders of the defined types were placed in this encounter.    Plan: 1. I recommended that she try melatonin from [facility]. 2. I have sent a prescription for Ambien but I have told her that I would not want her to continue to take this on a long-term basis. 3. She can also try lorazepam that she already has that may help with her stress temporarily. 4. Follow-up is  scheduled.   Meds ordered this encounter  Medications  . zolpidem (AMBIEN) 5 MG tablet    Sig: Take 1 tablet (5 mg total) by mouth at bedtime as needed for sleep.    Dispense:  15 tablet    Refill:  1    Shataya Winkles Luther Parody, MD

## 2021-03-01 ENCOUNTER — Encounter (INDEPENDENT_AMBULATORY_CARE_PROVIDER_SITE_OTHER): Payer: Self-pay | Admitting: Internal Medicine

## 2021-03-05 ENCOUNTER — Telehealth (INDEPENDENT_AMBULATORY_CARE_PROVIDER_SITE_OTHER): Payer: Self-pay

## 2021-03-05 NOTE — Telephone Encounter (Signed)
Patient called and stated that she found a very small tick on her stomach and pulled it off with tweezers and cleaned with alcohol. Patient also stated that her husband passed away yesterday.  I called the patient back and let her know to look for a bullseye around bite area, also if she has any fatigue, headaches, red spots or blotches or rash within the next week to 3 weeks to please let us know or if she has any concerns to please let us know. Patient verbalized an understanding.

## 2021-03-06 ENCOUNTER — Encounter (INDEPENDENT_AMBULATORY_CARE_PROVIDER_SITE_OTHER): Payer: Self-pay | Admitting: Internal Medicine

## 2021-03-07 ENCOUNTER — Other Ambulatory Visit (INDEPENDENT_AMBULATORY_CARE_PROVIDER_SITE_OTHER): Payer: Self-pay | Admitting: Internal Medicine

## 2021-03-07 MED ORDER — NP THYROID 30 MG PO TABS: 30.0000 mg | ORAL_TABLET | Freq: Every day | ORAL | 1 refills | Status: AC

## 2021-03-13 ENCOUNTER — Encounter (INDEPENDENT_AMBULATORY_CARE_PROVIDER_SITE_OTHER): Payer: Self-pay | Admitting: Internal Medicine

## 2021-03-13 NOTE — Telephone Encounter (Signed)
Please advise 

## 2021-03-14 ENCOUNTER — Encounter (INDEPENDENT_AMBULATORY_CARE_PROVIDER_SITE_OTHER): Payer: Self-pay | Admitting: Internal Medicine

## 2021-03-15 ENCOUNTER — Encounter (INDEPENDENT_AMBULATORY_CARE_PROVIDER_SITE_OTHER): Payer: Self-pay | Admitting: Internal Medicine

## 2021-03-15 ENCOUNTER — Telehealth (INDEPENDENT_AMBULATORY_CARE_PROVIDER_SITE_OTHER): Payer: Self-pay

## 2021-03-15 NOTE — Telephone Encounter (Signed)
Patient called and wanted to let Dr. Anastasio Champion that she took 14 of the 1 mg of Melatonin and this is helping. Patient wanted to let you know that she had the 2.5mg  of the Lorazepam on her table and she did not need to take it and she may need to take 15mg  of Melatonin tonight. Patient also wanted to let you know that she has also ordered the Ashwagandha from [facility] as they recommended this for her elevated stress. Patient stated that she really appreciates everything and says thank you. And if you want to message her she would be grateful.

## 2021-04-04 ENCOUNTER — Encounter (INDEPENDENT_AMBULATORY_CARE_PROVIDER_SITE_OTHER): Payer: Self-pay | Admitting: Internal Medicine

## 2021-04-04 ENCOUNTER — Other Ambulatory Visit: Payer: Self-pay

## 2021-04-04 ENCOUNTER — Ambulatory Visit (INDEPENDENT_AMBULATORY_CARE_PROVIDER_SITE_OTHER): Payer: Medicare Other | Admitting: Internal Medicine

## 2021-04-04 VITALS — BP 108/68 | HR 84 | Temp 97.1°F | Ht 65.0 in | Wt 156.8 lb

## 2021-04-04 DIAGNOSIS — F5104 Psychophysiologic insomnia: Secondary | ICD-10-CM | POA: Diagnosis not present

## 2021-04-04 DIAGNOSIS — F4321 Adjustment disorder with depressed mood: Secondary | ICD-10-CM

## 2021-04-04 DIAGNOSIS — G8929 Other chronic pain: Secondary | ICD-10-CM

## 2021-04-04 DIAGNOSIS — E2839 Other primary ovarian failure: Secondary | ICD-10-CM

## 2021-04-04 DIAGNOSIS — M545 Low back pain, unspecified: Secondary | ICD-10-CM

## 2021-04-04 NOTE — Progress Notes (Signed)
Metrics: Intervention Frequency ACO  Documented Smoking Status Yearly  Screened one or more times in 24 months  Cessation Counseling or  Active cessation medication Past 24 months  Past 24 months   Guideline developer: UpToDate (See UpToDate for funding source) Date Released: 2014       Wellness Office Visit  Subjective:  Patient ID: Rachael Padilla, female    DOB: 12/16/1950  Age: 70 y.o. MRN: [id number]  CC: This lady comes in for follow-up of B HRT, insomnia, chronic back pain. HPI  Her husband passed away just about a month ago and she is surprisingly doing well with the grief reaction.  She had great amount of difficulty with insomnia soon after he died but with a combination of melatonin and ashwagandha from [facility], she is doing extremely well and is able to sleep and has a sense of peace. She continues on estradiol patch (she was not able to tolerate estradiol orally), progesterone, testosterone. She also continues on NP thyroid. Past Medical History:  Diagnosis Date   Decreased libido    HLD (hyperlipidemia)    Vitamin D deficiency disease    Past Surgical History:  Procedure Laterality Date   APPENDECTOMY     early 54s   AUGMENTATION MAMMAPLASTY     BREAST SURGERY     implants- early 2000   COLONOSCOPY N/A 04/09/2019   Procedure: COLONOSCOPY;  Surgeon: Danie Binder, MD;  [facility];  Service: Endoscopy;  Laterality: N/A;  9:30am   HEMORRHOID BANDING N/A 04/09/2019   Procedure: HEMORRHOID BANDING;  Surgeon: Danie Binder, MD;  [facility];  Service: Endoscopy;  Laterality: N/A;     Family History  Problem Relation Age of Onset   Mental illness Mother    Dementia Mother    Early death Father    Emphysema Father    Healthy Sister    Arthritis Brother    Healthy Daughter    Healthy Daughter    Early death Brother    Cirrhosis Brother    Alcohol abuse Brother    Breast cancer Paternal Aunt    Colon cancer Neg Hx      Social History   Social History Narrative   Widow since June 2022.was married for 21 years.Lives in [place]. Moved from [place]. 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    Social History   Tobacco Use   Smoking status: Former    Pack years: 0.00    Types: Cigarettes    Quit date: 1979    Years since quitting: 43.5   Smokeless tobacco: Never  Substance Use Topics   Alcohol use: Yes    Comment: rarely    Current Meds  Medication Sig   Ashwagandha 125 MG CAPS Take 125 mg by mouth at bedtime.   Cholecalciferol (DIALYVITE VITAMIN D 5000) 125 MCG (5000 UT) capsule Take 15,000 Units by mouth daily.   estradiol (VIVELLE-DOT) 0.1 MG/24HR patch 1 patch 2 (two) times a week.   LORazepam (ATIVAN) 0.5 MG tablet Take 1 tablet (0.5 mg total) by mouth 2 (two) times daily as needed for anxiety.   Melatonin 10 MG CAPS Take 10 mg by mouth at bedtime.   NP THYROID 120 MG tablet TAKE ONE TABLET BY MOUTH DAILY BEFORE BREAKFAST   NP THYROID 30 MG tablet Take 1 tablet (30 mg total) by mouth daily before lunch.   progesterone (PROMETRIUM) 200 MG capsule Take 1 capsule (200 mg total) by mouth every evening.   Testosterone  Propionate (FIRST-TESTOSTERONE MC) 2 % CREA Place 5 mg onto the skin daily.   vitamin C (ASCORBIC ACID) 500 MG tablet Take 1,500 mg by mouth daily.   [DISCONTINUED] zolpidem (AMBIEN) 5 MG tablet Take 1 tablet (5 mg total) by mouth at bedtime as needed for sleep.     [place] Office Visit from 08/08/2020 in [facility]  PHQ-9 Total Score 6       Objective:   Today's Vitals: BP 108/68   Pulse 84   Temp (!) 97.1 F (36.2 C) (Temporal)   Ht 5\' 5"  (1.651 m)   Wt 156 lb 12.8 oz (71.1 kg)   SpO2 96%   BMI 26.09 kg/m  Vitals with BMI 04/04/2021 02/28/2021 12/13/2020  Height 5\' 5"  5\' 5"  5\' 5"   Weight 156 lbs 13 oz 162 lbs 6 oz 166 lbs  BMI 26.09 67.12 45.80  Systolic 998 338 250  Diastolic 68 82 67  Pulse 84 85 80     Physical Exam   She looks systemically  well.  She has lost weight but overall still appears to be healthy.    Assessment   1. Psychophysiological insomnia   2. Grief reaction   3. Primary ovarian failure   4. Chronic bilateral low back pain without sciatica       Tests ordered No orders of the defined types were placed in this encounter.    Plan: 1.  She will continue with all bioidentical hormones as before. 2.  She has not still had an appointment with a neurosurgeon regarding her low back pain and abnormal MRI scan.  I ordered the referral to the neurosurgeon back in April of this year so we will chase this. 3.  Continue with melatonin as we can do for insomnia. 4.  I will get her to see Judson Roch in about a month's time for an annual Medicare wellness visit and I will see her in November for follow-up.    No orders of the defined types were placed in this encounter.   Doree Albee, MD

## 2021-04-18 DIAGNOSIS — Z23 Encounter for immunization: Secondary | ICD-10-CM | POA: Diagnosis not present

## 2021-05-03 ENCOUNTER — Encounter (INDEPENDENT_AMBULATORY_CARE_PROVIDER_SITE_OTHER): Payer: Medicare Other | Admitting: Nurse Practitioner

## 2021-05-03 ENCOUNTER — Encounter (INDEPENDENT_AMBULATORY_CARE_PROVIDER_SITE_OTHER): Payer: Self-pay

## 2021-05-14 DIAGNOSIS — N951 Menopausal and female climacteric states: Secondary | ICD-10-CM | POA: Diagnosis not present

## 2021-05-14 DIAGNOSIS — R5382 Chronic fatigue, unspecified: Secondary | ICD-10-CM | POA: Diagnosis not present

## 2021-05-16 DIAGNOSIS — R6882 Decreased libido: Secondary | ICD-10-CM | POA: Diagnosis not present

## 2021-05-16 DIAGNOSIS — N898 Other specified noninflammatory disorders of vagina: Secondary | ICD-10-CM | POA: Diagnosis not present

## 2021-05-16 DIAGNOSIS — N951 Menopausal and female climacteric states: Secondary | ICD-10-CM | POA: Diagnosis not present

## 2021-05-16 DIAGNOSIS — G479 Sleep disorder, unspecified: Secondary | ICD-10-CM | POA: Diagnosis not present

## 2021-05-16 DIAGNOSIS — M5441 Lumbago with sciatica, right side: Secondary | ICD-10-CM | POA: Diagnosis not present

## 2021-05-16 DIAGNOSIS — R5382 Chronic fatigue, unspecified: Secondary | ICD-10-CM | POA: Diagnosis not present

## 2021-05-16 DIAGNOSIS — G8929 Other chronic pain: Secondary | ICD-10-CM | POA: Diagnosis not present

## 2021-05-16 DIAGNOSIS — M5442 Lumbago with sciatica, left side: Secondary | ICD-10-CM | POA: Diagnosis not present

## 2021-05-16 DIAGNOSIS — R4586 Emotional lability: Secondary | ICD-10-CM | POA: Diagnosis not present

## 2021-05-21 DIAGNOSIS — Z79890 Hormone replacement therapy (postmenopausal): Secondary | ICD-10-CM | POA: Diagnosis not present

## 2021-05-21 DIAGNOSIS — E039 Hypothyroidism, unspecified: Secondary | ICD-10-CM | POA: Diagnosis not present

## 2021-05-22 ENCOUNTER — Other Ambulatory Visit (HOSPITAL_COMMUNITY): Payer: Medicare Other

## 2021-05-22 ENCOUNTER — Encounter (HOSPITAL_COMMUNITY): Payer: Self-pay

## 2021-05-25 DIAGNOSIS — E039 Hypothyroidism, unspecified: Secondary | ICD-10-CM | POA: Diagnosis not present

## 2021-05-25 DIAGNOSIS — Z0001 Encounter for general adult medical examination with abnormal findings: Secondary | ICD-10-CM | POA: Diagnosis not present

## 2021-06-12 DIAGNOSIS — N951 Menopausal and female climacteric states: Secondary | ICD-10-CM | POA: Diagnosis not present

## 2021-06-13 DIAGNOSIS — E079 Disorder of thyroid, unspecified: Secondary | ICD-10-CM | POA: Diagnosis not present

## 2021-06-14 DIAGNOSIS — N951 Menopausal and female climacteric states: Secondary | ICD-10-CM | POA: Diagnosis not present

## 2021-06-14 DIAGNOSIS — G8929 Other chronic pain: Secondary | ICD-10-CM | POA: Diagnosis not present

## 2021-06-14 DIAGNOSIS — M545 Low back pain, unspecified: Secondary | ICD-10-CM | POA: Diagnosis not present

## 2021-06-14 DIAGNOSIS — G479 Sleep disorder, unspecified: Secondary | ICD-10-CM | POA: Diagnosis not present

## 2021-06-14 DIAGNOSIS — Z6826 Body mass index (BMI) 26.0-26.9, adult: Secondary | ICD-10-CM | POA: Diagnosis not present

## 2021-06-14 DIAGNOSIS — N63 Unspecified lump in unspecified breast: Secondary | ICD-10-CM | POA: Diagnosis not present

## 2021-06-14 DIAGNOSIS — E038 Other specified hypothyroidism: Secondary | ICD-10-CM | POA: Diagnosis not present

## 2021-06-14 DIAGNOSIS — R5382 Chronic fatigue, unspecified: Secondary | ICD-10-CM | POA: Diagnosis not present

## 2021-06-18 DIAGNOSIS — S233XXA Sprain of ligaments of thoracic spine, initial encounter: Secondary | ICD-10-CM | POA: Diagnosis not present

## 2021-06-18 DIAGNOSIS — M9903 Segmental and somatic dysfunction of lumbar region: Secondary | ICD-10-CM | POA: Diagnosis not present

## 2021-06-18 DIAGNOSIS — S338XXA Sprain of other parts of lumbar spine and pelvis, initial encounter: Secondary | ICD-10-CM | POA: Diagnosis not present

## 2021-06-18 DIAGNOSIS — M9901 Segmental and somatic dysfunction of cervical region: Secondary | ICD-10-CM | POA: Diagnosis not present

## 2021-06-18 DIAGNOSIS — M9902 Segmental and somatic dysfunction of thoracic region: Secondary | ICD-10-CM | POA: Diagnosis not present

## 2021-06-18 DIAGNOSIS — M47812 Spondylosis without myelopathy or radiculopathy, cervical region: Secondary | ICD-10-CM | POA: Diagnosis not present

## 2021-06-25 DIAGNOSIS — S233XXA Sprain of ligaments of thoracic spine, initial encounter: Secondary | ICD-10-CM | POA: Diagnosis not present

## 2021-06-25 DIAGNOSIS — M9903 Segmental and somatic dysfunction of lumbar region: Secondary | ICD-10-CM | POA: Diagnosis not present

## 2021-06-25 DIAGNOSIS — S338XXA Sprain of other parts of lumbar spine and pelvis, initial encounter: Secondary | ICD-10-CM | POA: Diagnosis not present

## 2021-06-25 DIAGNOSIS — M9901 Segmental and somatic dysfunction of cervical region: Secondary | ICD-10-CM | POA: Diagnosis not present

## 2021-06-25 DIAGNOSIS — M9902 Segmental and somatic dysfunction of thoracic region: Secondary | ICD-10-CM | POA: Diagnosis not present

## 2021-06-25 DIAGNOSIS — M47812 Spondylosis without myelopathy or radiculopathy, cervical region: Secondary | ICD-10-CM | POA: Diagnosis not present

## 2021-07-04 DIAGNOSIS — M9902 Segmental and somatic dysfunction of thoracic region: Secondary | ICD-10-CM | POA: Diagnosis not present

## 2021-07-04 DIAGNOSIS — M9903 Segmental and somatic dysfunction of lumbar region: Secondary | ICD-10-CM | POA: Diagnosis not present

## 2021-07-04 DIAGNOSIS — S233XXA Sprain of ligaments of thoracic spine, initial encounter: Secondary | ICD-10-CM | POA: Diagnosis not present

## 2021-07-04 DIAGNOSIS — M47812 Spondylosis without myelopathy or radiculopathy, cervical region: Secondary | ICD-10-CM | POA: Diagnosis not present

## 2021-07-04 DIAGNOSIS — M9901 Segmental and somatic dysfunction of cervical region: Secondary | ICD-10-CM | POA: Diagnosis not present

## 2021-07-04 DIAGNOSIS — S338XXA Sprain of other parts of lumbar spine and pelvis, initial encounter: Secondary | ICD-10-CM | POA: Diagnosis not present

## 2021-07-23 DIAGNOSIS — Z124 Encounter for screening for malignant neoplasm of cervix: Secondary | ICD-10-CM | POA: Diagnosis not present

## 2021-07-23 DIAGNOSIS — Z6826 Body mass index (BMI) 26.0-26.9, adult: Secondary | ICD-10-CM | POA: Diagnosis not present

## 2021-07-24 DIAGNOSIS — S338XXA Sprain of other parts of lumbar spine and pelvis, initial encounter: Secondary | ICD-10-CM | POA: Diagnosis not present

## 2021-07-24 DIAGNOSIS — S233XXA Sprain of ligaments of thoracic spine, initial encounter: Secondary | ICD-10-CM | POA: Diagnosis not present

## 2021-07-24 DIAGNOSIS — Z01419 Encounter for gynecological examination (general) (routine) without abnormal findings: Secondary | ICD-10-CM | POA: Diagnosis not present

## 2021-07-24 DIAGNOSIS — M9902 Segmental and somatic dysfunction of thoracic region: Secondary | ICD-10-CM | POA: Diagnosis not present

## 2021-07-24 DIAGNOSIS — M9903 Segmental and somatic dysfunction of lumbar region: Secondary | ICD-10-CM | POA: Diagnosis not present

## 2021-07-24 DIAGNOSIS — M9901 Segmental and somatic dysfunction of cervical region: Secondary | ICD-10-CM | POA: Diagnosis not present

## 2021-07-24 DIAGNOSIS — M47812 Spondylosis without myelopathy or radiculopathy, cervical region: Secondary | ICD-10-CM | POA: Diagnosis not present

## 2021-08-07 ENCOUNTER — Ambulatory Visit (INDEPENDENT_AMBULATORY_CARE_PROVIDER_SITE_OTHER): Payer: Medicare Other | Admitting: Internal Medicine

## 2021-08-27 DIAGNOSIS — M9903 Segmental and somatic dysfunction of lumbar region: Secondary | ICD-10-CM | POA: Diagnosis not present

## 2021-08-27 DIAGNOSIS — M47812 Spondylosis without myelopathy or radiculopathy, cervical region: Secondary | ICD-10-CM | POA: Diagnosis not present

## 2021-08-27 DIAGNOSIS — S233XXA Sprain of ligaments of thoracic spine, initial encounter: Secondary | ICD-10-CM | POA: Diagnosis not present

## 2021-08-27 DIAGNOSIS — M9902 Segmental and somatic dysfunction of thoracic region: Secondary | ICD-10-CM | POA: Diagnosis not present

## 2021-08-27 DIAGNOSIS — M9901 Segmental and somatic dysfunction of cervical region: Secondary | ICD-10-CM | POA: Diagnosis not present

## 2021-08-27 DIAGNOSIS — S338XXA Sprain of other parts of lumbar spine and pelvis, initial encounter: Secondary | ICD-10-CM | POA: Diagnosis not present

## 2021-09-10 DIAGNOSIS — Z1231 Encounter for screening mammogram for malignant neoplasm of breast: Secondary | ICD-10-CM | POA: Diagnosis not present

## 2021-09-24 DIAGNOSIS — S233XXA Sprain of ligaments of thoracic spine, initial encounter: Secondary | ICD-10-CM | POA: Diagnosis not present

## 2021-09-24 DIAGNOSIS — M47812 Spondylosis without myelopathy or radiculopathy, cervical region: Secondary | ICD-10-CM | POA: Diagnosis not present

## 2021-09-24 DIAGNOSIS — M9901 Segmental and somatic dysfunction of cervical region: Secondary | ICD-10-CM | POA: Diagnosis not present

## 2021-09-24 DIAGNOSIS — M9902 Segmental and somatic dysfunction of thoracic region: Secondary | ICD-10-CM | POA: Diagnosis not present

## 2021-09-24 DIAGNOSIS — M9903 Segmental and somatic dysfunction of lumbar region: Secondary | ICD-10-CM | POA: Diagnosis not present

## 2021-09-24 DIAGNOSIS — S338XXA Sprain of other parts of lumbar spine and pelvis, initial encounter: Secondary | ICD-10-CM | POA: Diagnosis not present

## 2021-09-28 DIAGNOSIS — E079 Disorder of thyroid, unspecified: Secondary | ICD-10-CM | POA: Diagnosis not present

## 2021-09-29 IMAGING — MG DIGITAL SCREENING BREAST BILAT IMPLANT W/ TOMO W/ CAD
9 of 12 series · 9 of 28 positions shown · non-contrast
Comparison: None

CLINICAL DATA: Screening.

EXAM:
DIGITAL SCREENING BILATERAL MAMMOGRAM WITH IMPLANTS, CAD AND TOMO
The patient has retropectoral implants. Standard and implant
displaced views were performed.

[L MLO (1 of 2)]
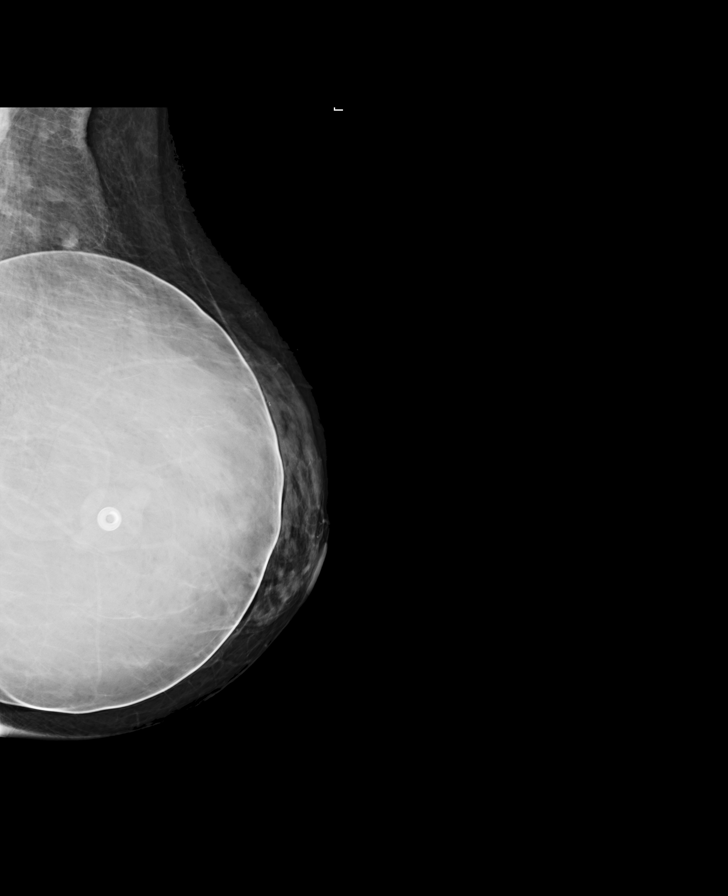

[L CC (1 of 2)]
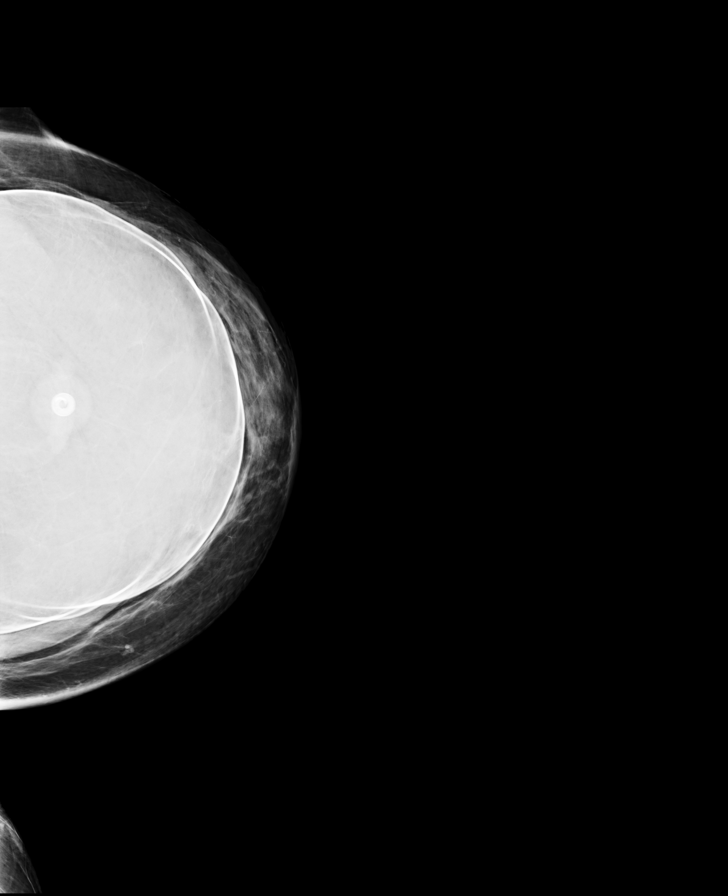

[R MLO (1 of 2)]
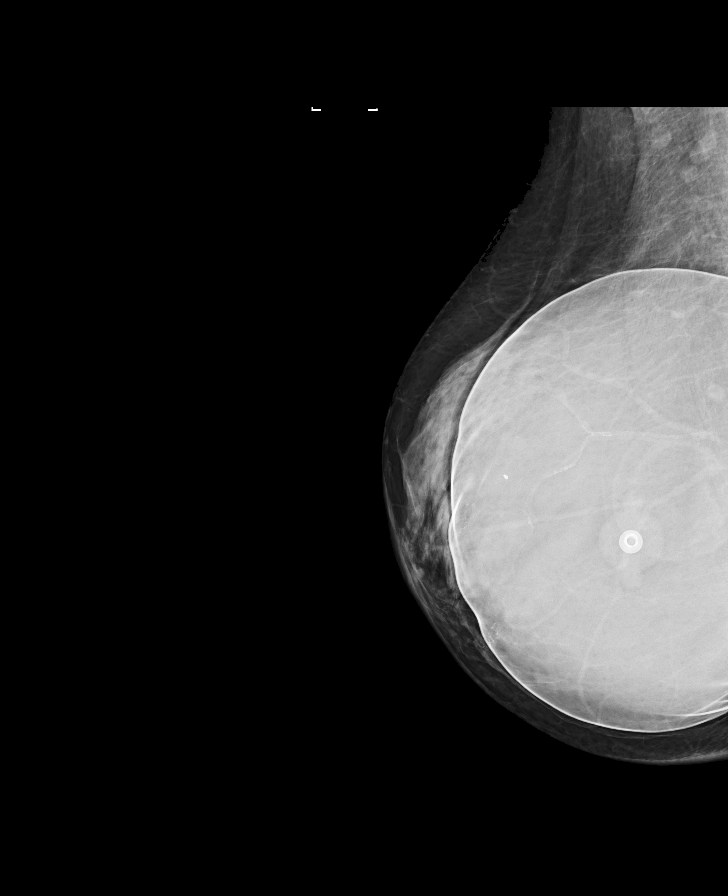

[R CC (1 of 2)]
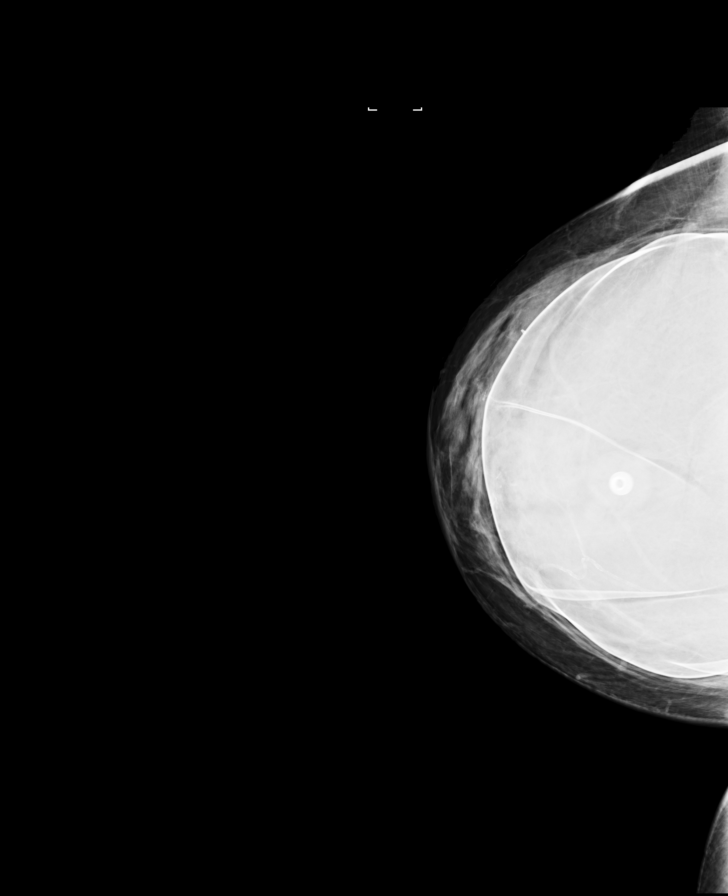

[L CC (2 of 2)]
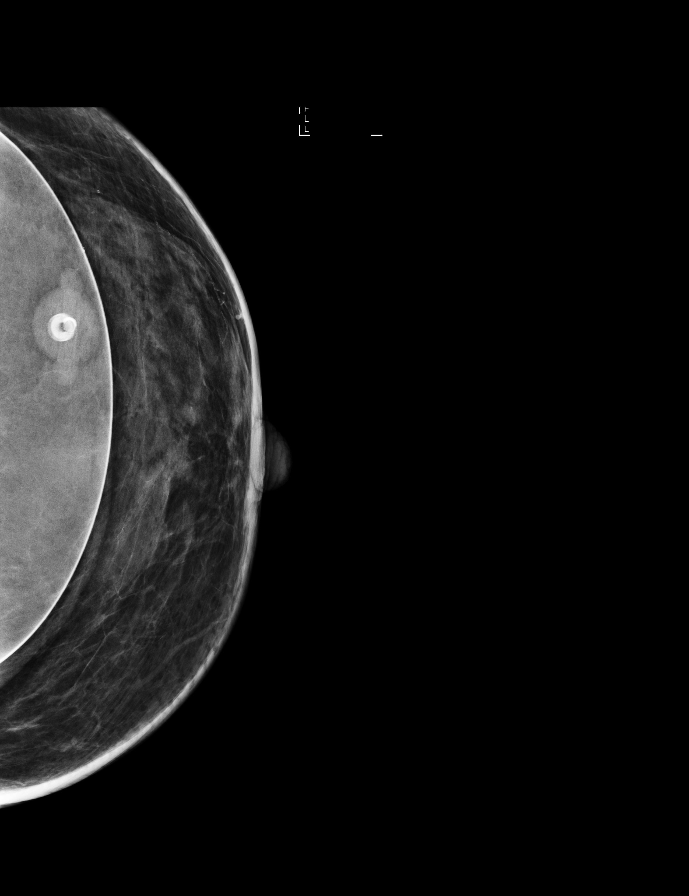

[L MLO (2 of 2)]
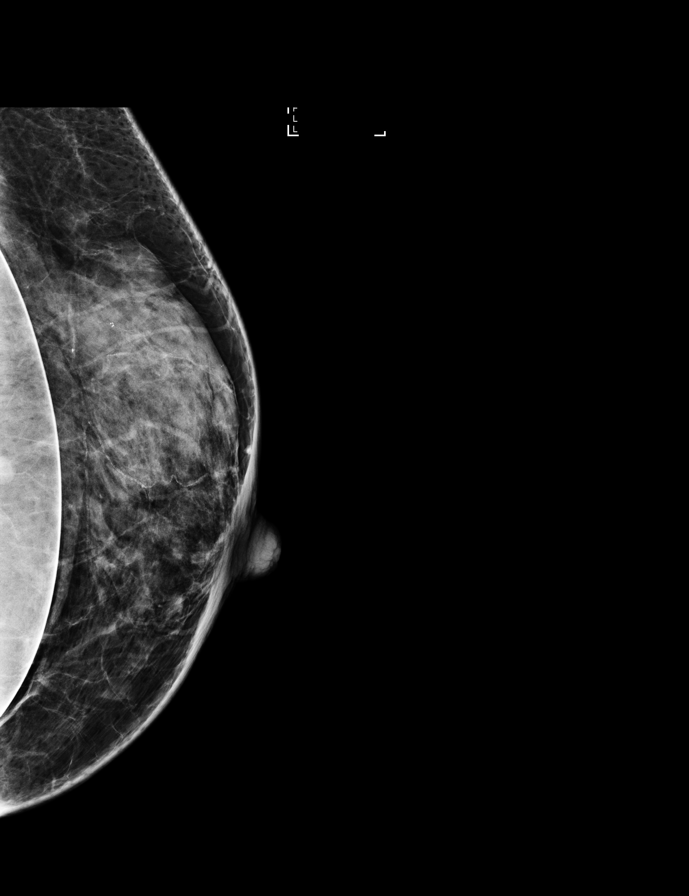

[R MLO (2 of 2)]
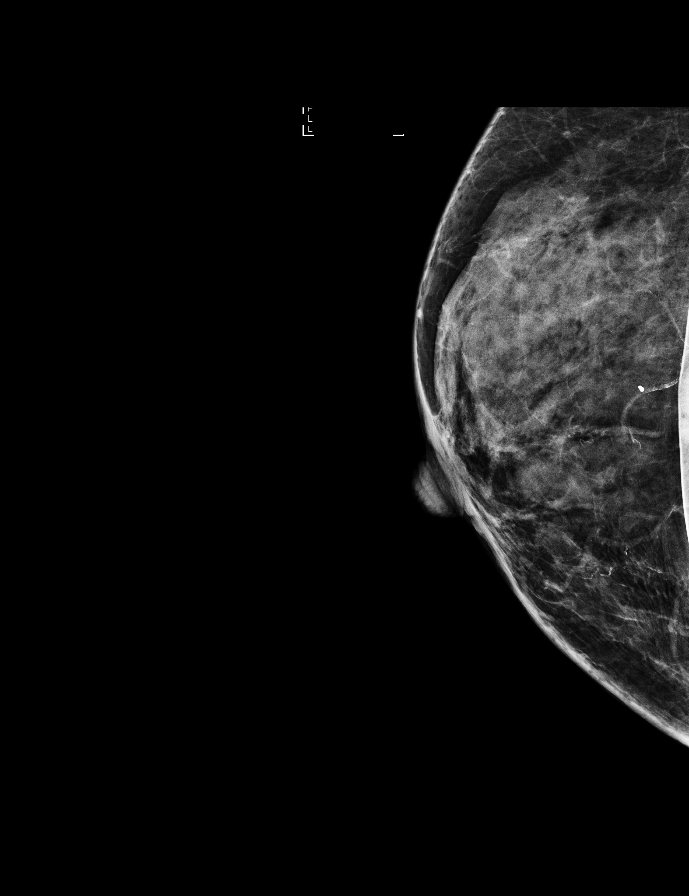

[R CC (2 of 2)]
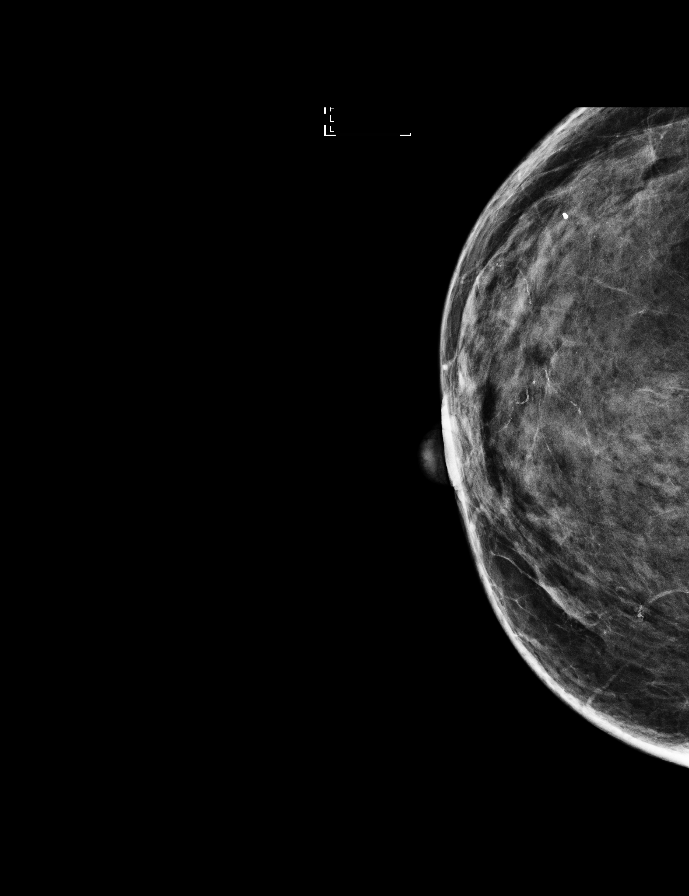

[L CCID BREAST TOMOSYNTHESIS IMAGE tomo · tomo slice 27/52.0]
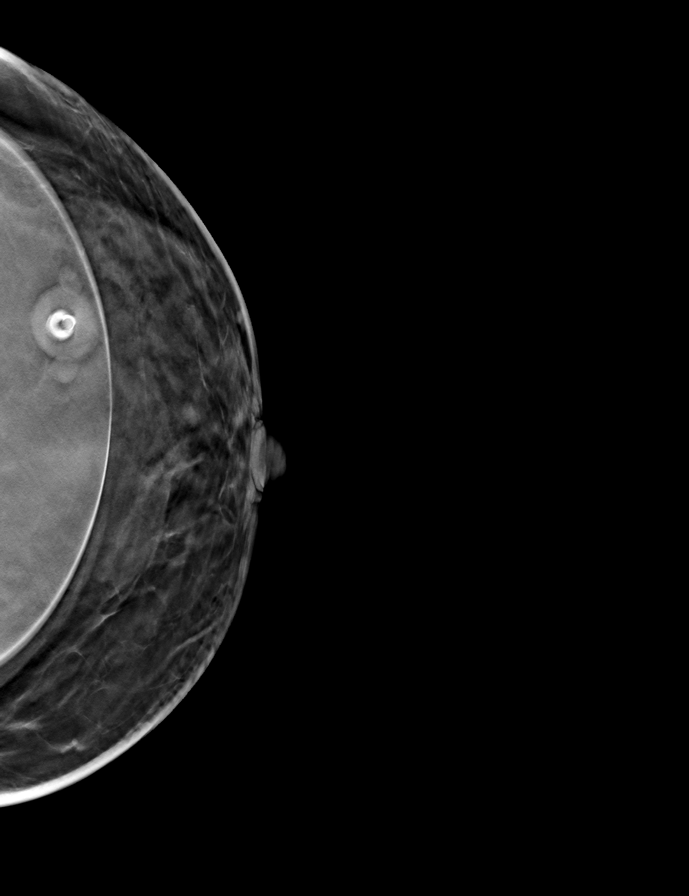

[9 of 28 positions shown; findings below may reference images not displayed]

ACR Breast Density Category c: The breast tissue is heterogeneously
dense, which may obscure small masses.
FINDINGS: There are no findings suspicious for malignancy. Images were
processed with CAD.
IMPRESSION: No mammographic evidence of malignancy. A result letter of this
screening mammogram will be mailed directly to the patient.

RECOMMENDATION:
Screening mammogram in one year. (Code:L4-0-DVT)

BI-RADS CATEGORY  1:  Negative.

## 2021-11-12 DIAGNOSIS — M9901 Segmental and somatic dysfunction of cervical region: Secondary | ICD-10-CM | POA: Diagnosis not present

## 2021-11-12 DIAGNOSIS — M9903 Segmental and somatic dysfunction of lumbar region: Secondary | ICD-10-CM | POA: Diagnosis not present

## 2021-11-12 DIAGNOSIS — S233XXA Sprain of ligaments of thoracic spine, initial encounter: Secondary | ICD-10-CM | POA: Diagnosis not present

## 2021-11-12 DIAGNOSIS — M47812 Spondylosis without myelopathy or radiculopathy, cervical region: Secondary | ICD-10-CM | POA: Diagnosis not present

## 2021-11-12 DIAGNOSIS — M9902 Segmental and somatic dysfunction of thoracic region: Secondary | ICD-10-CM | POA: Diagnosis not present

## 2021-11-29 DIAGNOSIS — M8589 Other specified disorders of bone density and structure, multiple sites: Secondary | ICD-10-CM | POA: Diagnosis not present

## 2021-11-29 DIAGNOSIS — M810 Age-related osteoporosis without current pathological fracture: Secondary | ICD-10-CM | POA: Diagnosis not present

## 2021-12-12 DIAGNOSIS — S233XXA Sprain of ligaments of thoracic spine, initial encounter: Secondary | ICD-10-CM | POA: Diagnosis not present

## 2021-12-12 DIAGNOSIS — S338XXA Sprain of other parts of lumbar spine and pelvis, initial encounter: Secondary | ICD-10-CM | POA: Diagnosis not present

## 2021-12-12 DIAGNOSIS — M9902 Segmental and somatic dysfunction of thoracic region: Secondary | ICD-10-CM | POA: Diagnosis not present

## 2021-12-12 DIAGNOSIS — M9901 Segmental and somatic dysfunction of cervical region: Secondary | ICD-10-CM | POA: Diagnosis not present

## 2021-12-12 DIAGNOSIS — M9903 Segmental and somatic dysfunction of lumbar region: Secondary | ICD-10-CM | POA: Diagnosis not present

## 2021-12-12 DIAGNOSIS — M47812 Spondylosis without myelopathy or radiculopathy, cervical region: Secondary | ICD-10-CM | POA: Diagnosis not present

## 2022-01-29 DIAGNOSIS — S233XXA Sprain of ligaments of thoracic spine, initial encounter: Secondary | ICD-10-CM | POA: Diagnosis not present

## 2022-01-29 DIAGNOSIS — M9903 Segmental and somatic dysfunction of lumbar region: Secondary | ICD-10-CM | POA: Diagnosis not present

## 2022-01-29 DIAGNOSIS — S338XXA Sprain of other parts of lumbar spine and pelvis, initial encounter: Secondary | ICD-10-CM | POA: Diagnosis not present

## 2022-01-29 DIAGNOSIS — M9902 Segmental and somatic dysfunction of thoracic region: Secondary | ICD-10-CM | POA: Diagnosis not present

## 2022-01-29 DIAGNOSIS — M9901 Segmental and somatic dysfunction of cervical region: Secondary | ICD-10-CM | POA: Diagnosis not present

## 2022-01-29 DIAGNOSIS — M47812 Spondylosis without myelopathy or radiculopathy, cervical region: Secondary | ICD-10-CM | POA: Diagnosis not present

## 2022-03-25 DIAGNOSIS — E079 Disorder of thyroid, unspecified: Secondary | ICD-10-CM | POA: Diagnosis not present

## 2022-03-27 DIAGNOSIS — G479 Sleep disorder, unspecified: Secondary | ICD-10-CM | POA: Diagnosis not present

## 2022-03-27 DIAGNOSIS — E079 Disorder of thyroid, unspecified: Secondary | ICD-10-CM | POA: Diagnosis not present

## 2022-06-26 DIAGNOSIS — R0981 Nasal congestion: Secondary | ICD-10-CM | POA: Diagnosis not present

## 2022-06-26 DIAGNOSIS — E079 Disorder of thyroid, unspecified: Secondary | ICD-10-CM | POA: Diagnosis not present

## 2022-06-28 DIAGNOSIS — G479 Sleep disorder, unspecified: Secondary | ICD-10-CM | POA: Diagnosis not present

## 2022-06-28 DIAGNOSIS — E079 Disorder of thyroid, unspecified: Secondary | ICD-10-CM | POA: Diagnosis not present

## 2022-08-16 DIAGNOSIS — M2550 Pain in unspecified joint: Secondary | ICD-10-CM | POA: Diagnosis not present

## 2022-08-16 DIAGNOSIS — M25519 Pain in unspecified shoulder: Secondary | ICD-10-CM | POA: Diagnosis not present

## 2022-08-21 DIAGNOSIS — M25511 Pain in right shoulder: Secondary | ICD-10-CM | POA: Diagnosis not present

## 2022-08-21 DIAGNOSIS — R110 Nausea: Secondary | ICD-10-CM | POA: Diagnosis not present

## 2022-08-21 DIAGNOSIS — M19011 Primary osteoarthritis, right shoulder: Secondary | ICD-10-CM | POA: Diagnosis not present

## 2022-08-28 DIAGNOSIS — Z6824 Body mass index (BMI) 24.0-24.9, adult: Secondary | ICD-10-CM | POA: Diagnosis not present

## 2022-08-29 ENCOUNTER — Other Ambulatory Visit: Payer: Self-pay | Admitting: Nurse Practitioner

## 2022-08-29 DIAGNOSIS — N950 Postmenopausal bleeding: Secondary | ICD-10-CM

## 2022-09-30 DIAGNOSIS — E079 Disorder of thyroid, unspecified: Secondary | ICD-10-CM | POA: Diagnosis not present

## 2022-10-04 DIAGNOSIS — E038 Other specified hypothyroidism: Secondary | ICD-10-CM | POA: Diagnosis not present

## 2022-10-04 DIAGNOSIS — Z6823 Body mass index (BMI) 23.0-23.9, adult: Secondary | ICD-10-CM | POA: Diagnosis not present

## 2022-10-04 DIAGNOSIS — G479 Sleep disorder, unspecified: Secondary | ICD-10-CM | POA: Diagnosis not present

## 2022-10-04 DIAGNOSIS — R5382 Chronic fatigue, unspecified: Secondary | ICD-10-CM | POA: Diagnosis not present

## 2022-11-11 DIAGNOSIS — Z1231 Encounter for screening mammogram for malignant neoplasm of breast: Secondary | ICD-10-CM | POA: Diagnosis not present

## 2022-11-12 DIAGNOSIS — L430 Hypertrophic lichen planus: Secondary | ICD-10-CM | POA: Diagnosis not present

## 2022-11-12 DIAGNOSIS — Z1283 Encounter for screening for malignant neoplasm of skin: Secondary | ICD-10-CM | POA: Diagnosis not present

## 2022-11-12 DIAGNOSIS — D485 Neoplasm of uncertain behavior of skin: Secondary | ICD-10-CM | POA: Diagnosis not present

## 2022-11-12 DIAGNOSIS — D225 Melanocytic nevi of trunk: Secondary | ICD-10-CM | POA: Diagnosis not present

## 2023-01-01 DIAGNOSIS — E079 Disorder of thyroid, unspecified: Secondary | ICD-10-CM | POA: Diagnosis not present

## 2023-01-03 DIAGNOSIS — G479 Sleep disorder, unspecified: Secondary | ICD-10-CM | POA: Diagnosis not present

## 2023-01-03 DIAGNOSIS — Z6823 Body mass index (BMI) 23.0-23.9, adult: Secondary | ICD-10-CM | POA: Diagnosis not present

## 2023-02-11 DIAGNOSIS — L710 Perioral dermatitis: Secondary | ICD-10-CM | POA: Diagnosis not present

## 2023-04-16 DIAGNOSIS — E079 Disorder of thyroid, unspecified: Secondary | ICD-10-CM | POA: Diagnosis not present

## 2023-04-18 DIAGNOSIS — Z6822 Body mass index (BMI) 22.0-22.9, adult: Secondary | ICD-10-CM | POA: Diagnosis not present

## 2023-04-18 DIAGNOSIS — R5382 Chronic fatigue, unspecified: Secondary | ICD-10-CM | POA: Diagnosis not present

## 2023-04-18 DIAGNOSIS — G479 Sleep disorder, unspecified: Secondary | ICD-10-CM | POA: Diagnosis not present

## 2023-05-28 DIAGNOSIS — L509 Urticaria, unspecified: Secondary | ICD-10-CM | POA: Diagnosis not present

## 2023-05-28 DIAGNOSIS — L500 Allergic urticaria: Secondary | ICD-10-CM | POA: Diagnosis not present

## 2023-07-16 DIAGNOSIS — E079 Disorder of thyroid, unspecified: Secondary | ICD-10-CM | POA: Diagnosis not present

## 2023-07-18 DIAGNOSIS — Z6823 Body mass index (BMI) 23.0-23.9, adult: Secondary | ICD-10-CM | POA: Diagnosis not present

## 2023-07-18 DIAGNOSIS — G479 Sleep disorder, unspecified: Secondary | ICD-10-CM | POA: Diagnosis not present

## 2023-07-18 DIAGNOSIS — M5441 Lumbago with sciatica, right side: Secondary | ICD-10-CM | POA: Diagnosis not present
